# Patient Record
Sex: Female | Born: 1995
Health system: Southern US, Community
[De-identification: ages and names within clinical notes are randomized; demographics above are authoritative.]

## PROBLEM LIST (undated history)

## (undated) DIAGNOSIS — B019 Varicella without complication: Secondary | ICD-10-CM

## (undated) HISTORY — PX: TOOTH EXTRACTION: SUR596

## (undated) HISTORY — PX: WISDOM TOOTH EXTRACTION: SHX21

## (undated) HISTORY — PX: TONSILLECTOMY: SUR1361

## (undated) HISTORY — PX: ADENOIDECTOMY: SUR15

## (undated) HISTORY — DX: Varicella without complication: B01.9

## (undated) HISTORY — PX: SHOULDER SURGERY: SHX246

---
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
## 2016-01-29 ENCOUNTER — Ambulatory Visit (INDEPENDENT_AMBULATORY_CARE_PROVIDER_SITE_OTHER): Payer: BLUE CROSS/BLUE SHIELD | Admitting: Psychology

## 2016-01-29 DIAGNOSIS — F4323 Adjustment disorder with mixed anxiety and depressed mood: Secondary | ICD-10-CM | POA: Diagnosis not present

## 2016-02-15 ENCOUNTER — Ambulatory Visit: Payer: Self-pay | Admitting: Psychology

## 2016-07-18 ENCOUNTER — Ambulatory Visit: Payer: Self-pay | Admitting: Internal Medicine

## 2016-07-23 ENCOUNTER — Ambulatory Visit (INDEPENDENT_AMBULATORY_CARE_PROVIDER_SITE_OTHER): Payer: BLUE CROSS/BLUE SHIELD | Admitting: Internal Medicine

## 2016-07-23 ENCOUNTER — Encounter: Payer: Self-pay | Admitting: Internal Medicine

## 2016-07-23 ENCOUNTER — Telehealth: Payer: Self-pay

## 2016-07-23 VITALS — BP 104/70 | HR 78 | Temp 98.8°F | Wt 122.8 lb

## 2016-07-23 DIAGNOSIS — F418 Other specified anxiety disorders: Secondary | ICD-10-CM | POA: Diagnosis not present

## 2016-07-23 DIAGNOSIS — F419 Anxiety disorder, unspecified: Principal | ICD-10-CM

## 2016-07-23 DIAGNOSIS — F329 Major depressive disorder, single episode, unspecified: Secondary | ICD-10-CM | POA: Insufficient documentation

## 2016-07-23 DIAGNOSIS — F32A Depression, unspecified: Secondary | ICD-10-CM | POA: Insufficient documentation

## 2016-07-23 MED ORDER — ESCITALOPRAM OXALATE 10 MG PO TABS: 10.0000 mg | ORAL_TABLET | Freq: Every day | ORAL | 1 refills | Status: DC

## 2016-07-23 NOTE — Patient Instructions (Signed)
Generalized Anxiety Disorder Generalized anxiety disorder (GAD) is a mental disorder. It interferes with life functions, including relationships, work, and school. GAD is different from normal anxiety, which everyone experiences at some point in their lives in response to specific life events and activities. Normal anxiety actually helps us prepare for and get through these life events and activities. Normal anxiety goes away after the event or activity is over.  GAD causes anxiety that is not necessarily related to specific events or activities. It also causes excess anxiety in proportion to specific events or activities. The anxiety associated with GAD is also difficult to control. GAD can vary from mild to severe. People with severe GAD can have intense waves of anxiety with physical symptoms (panic attacks).  SYMPTOMS The anxiety and worry associated with GAD are difficult to control. This anxiety and worry are related to many life events and activities and also occur more days than not for 6 months or longer. People with GAD also have three or more of the following symptoms (one or more in children):  Restlessness.   Fatigue.  Difficulty concentrating.   Irritability.  Muscle tension.  Difficulty sleeping or unsatisfying sleep. DIAGNOSIS GAD is diagnosed through an assessment by your health care provider. Your health care provider will ask you questions aboutyour mood,physical symptoms, and events in your life. Your health care provider may ask you about your medical history and use of alcohol or drugs, including prescription medicines. Your health care provider may also do a physical exam and blood tests. Certain medical conditions and the use of certain substances can cause symptoms similar to those associated with GAD. Your health care provider may refer you to a mental health specialist for further evaluation. TREATMENT The following therapies are usually used to treat GAD:    Medication. Antidepressant medication usually is prescribed for long-term daily control. Antianxiety medicines may be added in severe cases, especially when panic attacks occur.   Talk therapy (psychotherapy). Certain types of talk therapy can be helpful in treating GAD by providing support, education, and guidance. A form of talk therapy called cognitive behavioral therapy can teach you healthy ways to think about and react to daily life events and activities.  Stress managementtechniques. These include yoga, meditation, and exercise and can be very helpful when they are practiced regularly. A mental health specialist can help determine which treatment is best for you. Some people see improvement with one therapy. However, other people require a combination of therapies.   This information is not intended to replace advice given to you by your health care provider. Make sure you discuss any questions you have with your health care provider.   Document Released: 02/01/2013 Document Revised: 10/28/2014 Document Reviewed: 02/01/2013 Elsevier Interactive Patient Education 2016 Elsevier Inc.  

## 2016-07-23 NOTE — Telephone Encounter (Signed)
Victoria Brooks pts mom left v/m requesting cb about med. No DPR signed. When I called back Victoria Brooks had spoken with pharmacist and nothing further needed.

## 2016-07-23 NOTE — Progress Notes (Signed)
   Subjective:    Patient ID: Victoria Brooks, female    DOB: November 08, 1995, 20 y.o.   MRN: [id number]  HPI  Pt presents to the clinic today with c/o anxiety and depression. She started noticing these feelings back in high school, but she was never treated for this. She feels like she worries about everything, she is afraid that something bad may happen. She feels down, depressed and hopeless. She has lost interest in things that she enjoys. She is not sure what is triggering this. She has a good home life. She does not work or go to school. She is not having any issues with her friends. She did go to a therapist for 1 session, 3 months ago but reports she did not feel like it was therapuetic. She has a family history of anxiety and depression in her mother who is not treated for this.  She denies SI/HI.  Review of Systems      Past Medical History:  Diagnosis Date  . Chicken pox     Current Outpatient Prescriptions  Medication Sig Dispense Refill  . TRINESSA LO 0.18/0.215/0.25 MG-25 MCG tab      No current facility-administered medications for this visit.     No Known Allergies  Family History  Problem Relation Age of Onset  . Hypertension Maternal Uncle   . Cancer Maternal Grandmother     breast  . Heart disease Father   . Alzheimer's disease Maternal Grandfather     Social History   Social History  . Marital status: Single    Spouse name: N/A  . Number of children: N/A  . Years of education: N/A   Occupational History  . Not on file.   Social History Main Topics  . Smoking status: Never Smoker  . Smokeless tobacco: Not on file  . Alcohol use No  . Drug use: No  . Sexual activity: No   Other Topics Concern  . Not on file   Social History Narrative   Parents Divorced   Mom is custodial parent but, dad remains very involved   Brother 1 year older   Mom works for [employer]     Constitutional: Denies fever, malaise, fatigue, headache or  abrupt weight changes.  Neurological: Denies dizziness, difficulty with memory, difficulty with speech or problems with balance and coordination.  Psych: Pt reports anxiety and depression. Denies SI/HI.  No other specific complaints in a complete review of systems (except as listed in HPI above).  Objective:   Physical Exam   BP 104/70   Pulse 78   Temp 98.8 F (37.1 C) (Oral)   Wt 122 lb 12 oz (55.7 kg)   LMP 07/08/2016   SpO2 98%  Wt Readings from Last 3 Encounters:  07/23/16 122 lb 12 oz (55.7 kg) (43 %, Z= -0.17)*  09/07/14 136 lb (61.7 kg) (73 %, Z= 0.62)*  08/01/12 127 lb (57.6 kg) (70 %, Z= 0.54)*   * Growth percentiles are based on CDC 2-20 Years data.    General: Appears her stated age, well developed, well nourished in NAD. Neurological: Alert and oriented.  Psychiatric: Mood and affect flat.     Assessment & Plan:

## 2016-07-23 NOTE — Assessment & Plan Note (Signed)
Unknown cause She is not interested in referral to therapy to discuss this further Support offered today Discussed medication management- benefits, risks, side effects eRx for Lexapro 10 mg QHS  RTC in 4-6 weeks to follow up anxiety and depression

## 2016-08-05 ENCOUNTER — Ambulatory Visit (INDEPENDENT_AMBULATORY_CARE_PROVIDER_SITE_OTHER): Payer: BLUE CROSS/BLUE SHIELD | Admitting: Psychology

## 2016-08-05 DIAGNOSIS — F321 Major depressive disorder, single episode, moderate: Secondary | ICD-10-CM | POA: Diagnosis not present

## 2016-08-19 ENCOUNTER — Ambulatory Visit: Payer: Self-pay | Admitting: Psychology

## 2016-08-20 ENCOUNTER — Ambulatory Visit: Payer: BLUE CROSS/BLUE SHIELD | Admitting: Internal Medicine

## 2016-08-22 ENCOUNTER — Encounter: Payer: Self-pay | Admitting: Internal Medicine

## 2016-08-22 ENCOUNTER — Ambulatory Visit (INDEPENDENT_AMBULATORY_CARE_PROVIDER_SITE_OTHER): Payer: BLUE CROSS/BLUE SHIELD | Admitting: Internal Medicine

## 2016-08-22 DIAGNOSIS — F329 Major depressive disorder, single episode, unspecified: Secondary | ICD-10-CM

## 2016-08-22 DIAGNOSIS — F418 Other specified anxiety disorders: Secondary | ICD-10-CM | POA: Diagnosis not present

## 2016-08-22 DIAGNOSIS — F32A Depression, unspecified: Secondary | ICD-10-CM

## 2016-08-22 DIAGNOSIS — F419 Anxiety disorder, unspecified: Principal | ICD-10-CM

## 2016-08-22 MED ORDER — ESCITALOPRAM OXALATE 20 MG PO TABS: 20.0000 mg | ORAL_TABLET | Freq: Every day | ORAL | 1 refills | Status: DC

## 2016-08-22 NOTE — Assessment & Plan Note (Addendum)
Unchanged Will stop Lexapro  Increase Lexapro to 20 mg daily If symptoms persist or worsen, will stop Lexapro and start Prozac

## 2016-08-22 NOTE — Patient Instructions (Signed)
Major Depressive Disorder Major depressive disorder is a mental illness. It also may be called clinical depression or unipolar depression. Major depressive disorder usually causes feelings of sadness, hopelessness, or helplessness. Some people with this disorder do not feel particularly sad but lose interest in doing things they used to enjoy (anhedonia). Major depressive disorder also can cause physical symptoms. It can interfere with work, school, relationships, and other normal everyday activities. The disorder varies in severity but is longer lasting and more serious than the sadness we all feel from time to time in our lives. Major depressive disorder often is triggered by stressful life events or major life changes. Examples of these triggers include divorce, loss of your job or home, a move, and the death of a family member or close friend. Sometimes this disorder occurs for no obvious reason at all. People who have family members with major depressive disorder or bipolar disorder are at higher risk for developing this disorder, with or without life stressors. Major depressive disorder can occur at any age. It may occur just once in your life (single episode major depressive disorder). It may occur multiple times (recurrent major depressive disorder). SYMPTOMS People with major depressive disorder have either anhedonia or depressed mood on nearly a daily basis for at least 2 weeks or longer. Symptoms of depressed mood include:  Feelings of sadness (blue or down in the dumps) or emptiness.  Feelings of hopelessness or helplessness.  Tearfulness or episodes of crying (may be observed by others).  Irritability (children and adolescents). In addition to depressed mood or anhedonia or both, people with this disorder have at least four of the following symptoms:  Difficulty sleeping or sleeping too much.   Significant change (increase or decrease) in appetite or weight.   Lack of energy or  motivation.  Feelings of guilt and worthlessness.   Difficulty concentrating, remembering, or making decisions.  Unusually slow movement (psychomotor retardation) or restlessness (as observed by others).   Recurrent wishes for death, recurrent thoughts of self-harm (suicide), or a suicide attempt. People with major depressive disorder commonly have persistent negative thoughts about themselves, other people, and the world. People with severe major depressive disorder may experiencedistorted beliefs or perceptions about the world (psychotic delusions). They also may see or hear things that are not real (psychotic hallucinations). DIAGNOSIS Major depressive disorder is diagnosed through an assessment by your health care provider. Your health care provider will ask aboutaspects of your daily life, such as mood,sleep, and appetite, to see if you have the diagnostic symptoms of major depressive disorder. Your health care provider may ask about your medical history and use of alcohol or drugs, including prescription medicines. Your health care provider also may do a physical exam and blood work. This is because certain medical conditions and the use of certain substances can cause major depressive disorder-like symptoms (secondary depression). Your health care provider also may refer you to a mental health specialist for further evaluation and treatment. TREATMENT It is important to recognize the symptoms of major depressive disorder and seek treatment. The following treatments can be prescribed for this disorder:   Medicine. Antidepressant medicines usually are prescribed. Antidepressant medicines are thought to correct chemical imbalances in the brain that are commonly associated with major depressive disorder. Other types of medicine may be added if the symptoms do not respond to antidepressant medicines alone or if psychotic delusions or hallucinations occur.  Talk therapy. Talk therapy can be  helpful in treating major depressive disorder by providing   support, education, and guidance. Certain types of talk therapy also can help with negative thinking (cognitive behavioral therapy) and with relationship issues that trigger this disorder (interpersonal therapy). A mental health specialist can help determine which treatment is best for you. Most people with major depressive disorder do well with a combination of medicine and talk therapy. Treatments involving electrical stimulation of the brain can be used in situations with extremely severe symptoms or when medicine and talk therapy do not work over time. These treatments include electroconvulsive therapy, transcranial magnetic stimulation, and vagal nerve stimulation.   This information is not intended to replace advice given to you by your health care provider. Make sure you discuss any questions you have with your health care provider.   Document Released: 02/01/2013 Document Revised: 10/28/2014 Document Reviewed: 02/01/2013 Elsevier Interactive Patient Education 2016 Elsevier Inc.  

## 2016-08-22 NOTE — Progress Notes (Signed)
   Subjective:    Patient ID: Victoria Brooks, female    DOB: 07-18-1996, 20 y.o.   MRN: [id number]  HPI  Pt presents to the clinic today to follow up anxiety and depression. She was started on Lexapro 10 mg at her last visit. She has been taking the medication as prescribed. She reports no improvement in her symptoms but reports they are not worse. She still feels anxious and on edge, worrying all the time. She still feels down and depressed. She is not sure what is triggering this. She denies SI/HI.  Review of Systems      Past Medical History:  Diagnosis Date  . Chicken pox     Current Outpatient Prescriptions  Medication Sig Dispense Refill  . escitalopram (LEXAPRO) 10 MG tablet Take 1 tablet (10 mg total) by mouth at bedtime. 30 tablet 1  . TRINESSA LO 0.18/0.215/0.25 MG-25 MCG tab      No current facility-administered medications for this visit.     No Known Allergies  Family History  Problem Relation Age of Onset  . Hypertension Maternal Uncle   . Cancer Maternal Grandmother     breast  . Heart disease Father   . Alzheimer's disease Maternal Grandfather     Social History   Social History  . Marital status: Single    Spouse name: N/A  . Number of children: N/A  . Years of education: N/A   Occupational History  . Not on file.   Social History Main Topics  . Smoking status: Never Smoker  . Smokeless tobacco: Not on file  . Alcohol use No  . Drug use: No  . Sexual activity: No   Other Topics Concern  . Not on file   Social History Narrative   Parents Divorced   Mom is custodial parent but, dad remains very involved   Brother 1 year older   Mom works for [employer]     Constitutional: Denies fever, malaise, fatigue, headache or abrupt weight changes.  Neurological: Denies dizziness, difficulty with memory, difficulty with speech or problems with balance and coordination.  Psych: Pt reports anxiety and depression. Denies  SI/HI.  No other specific complaints in a complete review of systems (except as listed in HPI above).  Objective:   Physical Exam  BP 106/68   Pulse 70   Temp 97.7 F (36.5 C) (Oral)   Wt 127 lb (57.6 kg)   LMP 08/08/2016   SpO2 98%  Wt Readings from Last 3 Encounters:  08/22/16 127 lb (57.6 kg) (51 %, Z= 0.03)*  07/23/16 122 lb 12 oz (55.7 kg) (43 %, Z= -0.17)*  09/07/14 136 lb (61.7 kg) (73 %, Z= 0.62)*   * Growth percentiles are based on CDC 2-20 Years data.    General: Appears her stated age, well developed, well nourished in NAD. Neurological: Alert and oriented.  Psychiatric: Mood and affect flat.          Assessment & Plan:

## 2016-08-29 ENCOUNTER — Encounter: Payer: Self-pay | Admitting: Internal Medicine

## 2016-09-03 ENCOUNTER — Ambulatory Visit (INDEPENDENT_AMBULATORY_CARE_PROVIDER_SITE_OTHER): Payer: BLUE CROSS/BLUE SHIELD | Admitting: Psychology

## 2016-09-03 DIAGNOSIS — F321 Major depressive disorder, single episode, moderate: Secondary | ICD-10-CM | POA: Diagnosis not present

## 2016-09-23 ENCOUNTER — Ambulatory Visit: Payer: BLUE CROSS/BLUE SHIELD | Admitting: Psychology

## 2016-10-02 ENCOUNTER — Encounter: Payer: Self-pay | Admitting: Internal Medicine

## 2016-10-03 ENCOUNTER — Ambulatory Visit: Payer: Self-pay | Admitting: Internal Medicine

## 2016-10-03 MED ORDER — ESCITALOPRAM OXALATE 20 MG PO TABS: 20.0000 mg | ORAL_TABLET | Freq: Every day | ORAL | 5 refills | Status: DC

## 2016-10-12 ENCOUNTER — Encounter: Payer: Self-pay | Admitting: Internal Medicine

## 2016-10-15 ENCOUNTER — Other Ambulatory Visit: Payer: Self-pay | Admitting: Internal Medicine

## 2016-10-15 ENCOUNTER — Telehealth: Payer: Self-pay

## 2016-10-15 NOTE — Telephone Encounter (Signed)
Amy called re reifll lexapro; spoke with Cina at [facility] and pt has available refills and will get refill ready. Amy notified and voiced understanding.

## 2017-01-12 ENCOUNTER — Encounter: Payer: Self-pay | Admitting: Internal Medicine

## 2017-03-26 ENCOUNTER — Ambulatory Visit (INDEPENDENT_AMBULATORY_CARE_PROVIDER_SITE_OTHER): Payer: Managed Care, Other (non HMO) | Admitting: Family Medicine

## 2017-03-26 ENCOUNTER — Encounter: Payer: Self-pay | Admitting: Family Medicine

## 2017-03-26 VITALS — BP 102/64 | HR 99 | Temp 98.3°F | Wt 129.0 lb

## 2017-03-26 DIAGNOSIS — Z113 Encounter for screening for infections with a predominantly sexual mode of transmission: Secondary | ICD-10-CM | POA: Diagnosis not present

## 2017-03-26 DIAGNOSIS — N926 Irregular menstruation, unspecified: Secondary | ICD-10-CM

## 2017-03-26 DIAGNOSIS — L731 Pseudofolliculitis barbae: Secondary | ICD-10-CM | POA: Insufficient documentation

## 2017-03-26 NOTE — Progress Notes (Signed)
Subjective:    Patient ID: Victoria Brooks, female    DOB: Sep 11, 1996, 21 y.o.   MRN: [id number]  HPI Here for gyn problems Menses for 18 days  Also swollen LN in groin R side   Wt Readings from Last 3 Encounters:  03/26/17 129 lb (58.5 kg) (52 %, Z= 0.06)*  08/22/16 127 lb (57.6 kg) (51 %, Z= 0.03)*  07/23/16 122 lb 12 oz (55.7 kg) (43 %, Z= -0.17)*   * Growth percentiles are based on CDC 2-20 Years data.    Is on tri sprintek for OC   Periods are usually regular  Usually 3 heavy days and then 2 light days    This period- 3-4 heavy days and then over 2 wk of light days  Not a lot of cramping  On time-not late or early  Never happened before  Took one pill late - a week before menses (missed a day)   She is sexually active  Does not usually use condoms She has never been tested for STDS  Sometimes has vaginal d/c (usually clear)  No itching or burning  No pelvic pain   Has not had a pap test due to age  Has declined HPV vaccine so far   Patient Active Problem List   Diagnosis Date Noted  . Abnormal menses 03/26/2017  . Ingrown hair 03/26/2017  . Screen for STD (sexually transmitted disease) 03/26/2017  . Anxiety and depression 07/23/2016   Past Medical History:  Diagnosis Date  . Chicken pox    Past Surgical History:  Procedure Laterality Date  . ADENOIDECTOMY    . SHOULDER SURGERY Right   . TONSILLECTOMY    . WISDOM TOOTH EXTRACTION     Social History  Substance Use Topics  . Smoking status: Former Games developer  . Smokeless tobacco: Never Used  . Alcohol use No   Family History  Problem Relation Age of Onset  . Heart disease Father   . Hypertension Maternal Uncle   . Cancer Maternal Grandmother        breast  . Alzheimer's disease Maternal Grandfather    No Known Allergies Current Outpatient Prescriptions on File Prior to Visit  Medication Sig Dispense Refill  . escitalopram (LEXAPRO) 20 MG tablet Take 1 tablet (20 mg total) by mouth at bedtime. 30  tablet 5   No current facility-administered medications on file prior to visit.     Review of Systems Review of Systems  Constitutional: Negative for fever, appetite change, fatigue and unexpected weight change.  Eyes: Negative for pain and visual disturbance.  Respiratory: Negative for cough and shortness of breath.   Cardiovascular: Negative for cp or palpitations    Gastrointestinal: Negative for nausea, diarrhea and constipation.  Genitourinary: Negative for urgency and frequency. neg for pelvic pain or changed vaginal d/c  Skin: Negative for pallor or rash   Neurological: Negative for weakness, light-headedness, numbness and headaches.  Hematological: Negative for adenopathy. Does not bruise/bleed easily.  Psychiatric/Behavioral: Negative for dysphoric mood. The patient is not nervous/anxious.         Objective:   Physical Exam  Constitutional: She appears well-developed and well-nourished. No distress.  Well appearing   Eyes: Conjunctivae and EOM are normal. Pupils are equal, round, and reactive to light.  Neck: Normal range of motion. Neck supple. No thyromegaly present.  Cardiovascular: Regular rhythm.   Pulmonary/Chest: Effort normal and breath sounds normal.  Abdominal: Soft. Bowel sounds are normal. She exhibits no distension and no mass.  There is no tenderness. There is no rebound and no guarding.  No suprapubic tenderness or fullness    Lymphadenopathy:    She has no cervical adenopathy.       Right: No inguinal adenopathy present.       Left: No inguinal adenopathy present.  Neurological: She is alert.  Skin: Skin is warm and dry. No rash noted. No erythema. No pallor.  R groin area-ingrown hair noted with some mild induration and erythema 1/2 by 1 cm  No pus or fluctuance   No LN noted  Psychiatric: She has a normal mood and affect.          Assessment & Plan:   Problem List Items Addressed This Visit      Musculoskeletal and Integument   Ingrown hair     R groin area- inflamed but does not appear infected  Recommend keeping clean with soap and water and use of warm compresses Stop shaving in this area  Alert if worse redness/swelling or pain        Other   Abnormal menses    Pt had an extended menstrual period- after missing a pill late in her pack  I suspect this is the reason She was also starting school/ under stress  Will continue current OC- stressed imp of compliance and risk of menstrual irreg or preg if she forgets Plan made to set alarm to take pill  If problems continue - she will let us know  Also reminded re: condom use to prevent stds        Screen for STD (sexually transmitted disease)    gc chlamydia screen today  She declines hiv or rpr  She declines hpv vaccine but enc to consider it later       Relevant Orders   GC/Chlamydia Probe Amp (Completed)

## 2017-03-26 NOTE — Patient Instructions (Signed)
You have an ingrown hair  Use a warm compress when able  Keep clean with soap and water Stop shaving  Stay on your oral contraceptive and set an alarm so you do not miss pills If period issues continue -let us know   We will do a urine screening for Gonorrhea and Chlamydia   I think the HPV vaccine is important -keep thinking about getting it

## 2017-03-27 LAB — GC/CHLAMYDIA PROBE AMP
CT Probe RNA: NOT DETECTED
GC Probe RNA: NOT DETECTED

## 2017-03-27 NOTE — Assessment & Plan Note (Signed)
gc chlamydia screen today  She declines hiv or rpr  She declines hpv vaccine but enc to consider it later

## 2017-03-27 NOTE — Assessment & Plan Note (Signed)
R groin area- inflamed but does not appear infected  Recommend keeping clean with soap and water and use of warm compresses Stop shaving in this area  Alert if worse redness/swelling or pain

## 2017-03-27 NOTE — Assessment & Plan Note (Signed)
Pt had an extended menstrual period- after missing a pill late in her pack  I suspect this is the reason She was also starting school/ under stress  Will continue current OC- stressed imp of compliance and risk of menstrual irreg or preg if she forgets Plan made to set alarm to take pill  If problems continue - she will let us know  Also reminded re: condom use to prevent stds

## 2017-04-27 ENCOUNTER — Other Ambulatory Visit: Payer: Self-pay | Admitting: Internal Medicine

## 2017-04-27 ENCOUNTER — Encounter: Payer: Self-pay | Admitting: Internal Medicine

## 2017-04-28 MED ORDER — ESCITALOPRAM OXALATE 20 MG PO TABS: 20.0000 mg | ORAL_TABLET | Freq: Every day | ORAL | 1 refills | Status: DC

## 2017-04-29 ENCOUNTER — Encounter: Payer: Self-pay | Admitting: Internal Medicine

## 2017-05-01 ENCOUNTER — Other Ambulatory Visit: Payer: Self-pay | Admitting: Internal Medicine

## 2017-06-25 ENCOUNTER — Encounter: Payer: Self-pay | Admitting: Internal Medicine

## 2017-06-30 ENCOUNTER — Other Ambulatory Visit: Payer: Self-pay | Admitting: Internal Medicine

## 2017-07-04 ENCOUNTER — Other Ambulatory Visit: Payer: Self-pay | Admitting: Internal Medicine

## 2017-07-29 ENCOUNTER — Ambulatory Visit: Payer: Self-pay | Admitting: Internal Medicine

## 2017-08-05 ENCOUNTER — Other Ambulatory Visit: Payer: Self-pay | Admitting: Internal Medicine

## 2017-08-12 ENCOUNTER — Ambulatory Visit: Payer: Self-pay | Admitting: Internal Medicine

## 2017-08-25 ENCOUNTER — Encounter: Payer: Self-pay | Admitting: Internal Medicine

## 2017-08-25 ENCOUNTER — Telehealth: Payer: Self-pay

## 2017-08-25 NOTE — Telephone Encounter (Signed)
That slot appears to have been taken already. Has she been rescheduled?

## 2017-08-25 NOTE — Telephone Encounter (Signed)
There is a 4930' appt on 08/26/17 at 8:15 but it is followed by a CPX.Please advise.

## 2017-08-25 NOTE — Telephone Encounter (Signed)
PLEASE NOTE: All timestamps contained within this report are represented as [place] Standard Time. CONFIDENTIALTY NOTICE: This fax transmission is intended only for the addressee. It contains information that is legally privileged, confidential or otherwise protected from use or disclosure. If you are not the intended recipient, you are strictly prohibited from reviewing, disclosing, copying using or disseminating any of this information or taking any action in reliance on or regarding this information. If you have received this fax in error, please notify us immediately by telephone so that we can arrange for its return to us. Phone: [phone], Toll-Free: [phone], Fax: [phone] Page: 1 of 1 Call Id: [id number] [facility] - Client Nonclinical Telephone Record [facility] Client [facility] - Client [facility] Night Physician Nicki ReaperBaity, [place] - NP Contact Type Call Who Is Calling Patient / Member / Family / Caregiver Caller Name Kate SableHayley Toutant Caller Phone Number [phone] Patient Name Kate SableHayley Kath Call Type Message Only Information Provided Reason for Call Request to Reschedule Office Appointment Initial Comment Caller needs to reschedule her appointment for tomorrow, November 6. 2018. Additional Comment Call Closed By: Milagros EvenerAmy Carter Transaction Date/Time: 08/24/2017 6:04:32 PM (ET)

## 2017-09-09 ENCOUNTER — Ambulatory Visit: Payer: Self-pay | Admitting: Internal Medicine

## 2017-09-09 ENCOUNTER — Telehealth: Payer: Self-pay

## 2017-09-09 NOTE — Progress Notes (Deleted)
   Subjective:    Patient ID: Victoria Brooks, female    DOB: [birth year], 21 y.o.   MRN: [id number]  HPI  Pt presents to the clinic today for her annual exam. She is also due to follow up chronic conditions.  Flu: Tetanus: Dentist:  Diet: Exercise:   Review of Systems      Past Medical History:  Diagnosis Date  . Chicken pox     Current Outpatient Medications  Medication Sig Dispense Refill  . escitalopram (LEXAPRO) 20 MG tablet TAKE 1 TABLET (20 MG TOTAL) BY MOUTH AT BEDTIME. MUST SCHEDULE ANNUAL PHYSICAL 30 tablet 0  . TRI-SPRINTEC 0.18/0.215/0.25 MG-35 MCG tablet Take 1 tablet by mouth daily.     No current facility-administered medications for this visit.     No Known Allergies  Family History  Problem Relation Age of Onset  . Heart disease Father   . Hypertension Maternal Uncle   . Cancer Maternal Grandmother        breast  . Alzheimer's disease Maternal Grandfather     Social History   Socioeconomic History  . Marital status: Single    Spouse name: Not on file  . Number of children: Not on file  . Years of education: Not on file  . Highest education level: Not on file  Social Needs  . Financial resource strain: Not on file  . Food insecurity - worry: Not on file  . Food insecurity - inability: Not on file  . Transportation needs - medical: Not on file  . Transportation needs - non-medical: Not on file  Occupational History  . Not on file  Tobacco Use  . Smoking status: Former Games developermoker  . Smokeless tobacco: Never Used  Substance and Sexual Activity  . Alcohol use: No  . Drug use: No  . Sexual activity: No  Other Topics Concern  . Not on file  Social History Narrative   Parents Divorced   Mom is custodial parent but, dad remains very involved   Brother 1 year older   Mom works for [employer]     Constitutional: Denies fever, malaise, fatigue, headache or abrupt weight changes.  HEENT: Denies eye pain, eye redness, ear  pain, ringing in the ears, wax buildup, runny nose, nasal congestion, bloody nose, or sore throat. Respiratory: Denies difficulty breathing, shortness of breath, cough or sputum production.   Cardiovascular: Denies chest pain, chest tightness, palpitations or swelling in the hands or feet.  Gastrointestinal: Denies abdominal pain, bloating, constipation, diarrhea or blood in the stool.  GU: Denies urgency, frequency, pain with urination, burning sensation, blood in urine, odor or discharge. Musculoskeletal: Denies decrease in range of motion, difficulty with gait, muscle pain or joint pain and swelling.  Skin: Denies redness, rashes, lesions or ulcercations.  Neurological: Denies dizziness, difficulty with memory, difficulty with speech or problems with balance and coordination.  Psych: Denies anxiety, depression, SI/HI.  No other specific complaints in a complete review of systems (except as listed in HPI above).  Objective:   Physical Exam        Assessment & Plan:

## 2017-09-09 NOTE — Telephone Encounter (Signed)
Copied from CRM #[id number]. Topic: Quick Communication - Patient Running Late >> Sep 09, 2017 11:22 AM Landry MellowFoltz, Melissa J wrote: Patient called and is running 15 minutes late. Mom was told of the 10 minute late policy, and mom said that pt wouldn't be able to reschedule    Route to department's PEC pool.

## 2017-09-10 ENCOUNTER — Other Ambulatory Visit: Payer: Self-pay | Admitting: Internal Medicine

## 2017-10-07 ENCOUNTER — Ambulatory Visit (INDEPENDENT_AMBULATORY_CARE_PROVIDER_SITE_OTHER): Payer: Managed Care, Other (non HMO) | Admitting: Internal Medicine

## 2017-10-07 ENCOUNTER — Encounter: Payer: Self-pay | Admitting: Internal Medicine

## 2017-10-07 VITALS — BP 108/76 | HR 85 | Temp 98.0°F | Ht 65.75 in | Wt 136.0 lb

## 2017-10-07 DIAGNOSIS — F419 Anxiety disorder, unspecified: Secondary | ICD-10-CM | POA: Diagnosis not present

## 2017-10-07 DIAGNOSIS — F329 Major depressive disorder, single episode, unspecified: Secondary | ICD-10-CM

## 2017-10-07 DIAGNOSIS — Z0000 Encounter for general adult medical examination without abnormal findings: Secondary | ICD-10-CM | POA: Diagnosis not present

## 2017-10-07 DIAGNOSIS — F32A Depression, unspecified: Secondary | ICD-10-CM

## 2017-10-07 MED ORDER — SERTRALINE HCL 50 MG PO TABS: 50.0000 mg | ORAL_TABLET | Freq: Every day | ORAL | 11 refills | Status: DC

## 2017-10-07 NOTE — Patient Instructions (Signed)

## 2017-10-07 NOTE — Progress Notes (Signed)
Subjective:    Patient ID: Victoria SableHayley Suppes, female    DOB: 13-Jan-1996, 21 y.o.   MRN: [id number]  HPI  Pt presents to the clinic today for her annual exam. She is also due to follow up chronic conditions.  Anxiety and Depression: She does not feel like the Lexapro is as effective as it used to be. She is interested in trying something new. She did not feel therapy was effective. She denies SI/HI.  Flu: never Tetanus: 04/2009 Dentist: biannually  Diet: She does eat meat. She consumes fruits and veggies daily. She does eat fried foods. She drinks mostly water and juice. Exercise: None  Review of Systems      Past Medical History:  Diagnosis Date  . Chicken pox     Current Outpatient Medications  Medication Sig Dispense Refill  . escitalopram (LEXAPRO) 20 MG tablet TAKE 1 TABLET (20 MG TOTAL) BY MOUTH AT BEDTIME. MUST SCHEDULE ANNUAL PHYSICAL 30 tablet 0  . TRI-SPRINTEC 0.18/0.215/0.25 MG-35 MCG tablet Take 1 tablet by mouth daily.     No current facility-administered medications for this visit.     No Known Allergies  Family History  Problem Relation Age of Onset  . Heart disease Father   . Hypertension Maternal Uncle   . Cancer Maternal Grandmother        breast  . Alzheimer's disease Maternal Grandfather     Social History   Socioeconomic History  . Marital status: Single    Spouse name: Not on file  . Number of children: Not on file  . Years of education: Not on file  . Highest education level: Not on file  Social Needs  . Financial resource strain: Not on file  . Food insecurity - worry: Not on file  . Food insecurity - inability: Not on file  . Transportation needs - medical: Not on file  . Transportation needs - non-medical: Not on file  Occupational History  . Not on file  Tobacco Use  . Smoking status: Former Games developermoker  . Smokeless tobacco: Never Used  Substance and Sexual Activity  . Alcohol use: No  . Drug use: No  . Sexual activity: No  Other  Topics Concern  . Not on file  Social History Narrative   Parents Divorced   Mom is custodial parent but, dad remains very involved   Brother 1 year older   Mom works for [employer] insurance     Constitutional: Denies fever, malaise, fatigue, headache or abrupt weight changes.  HEENT: Denies eye pain, eye redness, ear pain, ringing in the ears, wax buildup, runny nose, nasal congestion, bloody nose, or sore throat. Respiratory: Denies difficulty breathing, shortness of breath, cough or sputum production.   Cardiovascular: Denies chest pain, chest tightness, palpitations or swelling in the hands or feet.  Gastrointestinal: Denies abdominal pain, bloating, constipation, diarrhea or blood in the stool.  GU: Denies urgency, frequency, pain with urination, burning sensation, blood in urine, odor or discharge. Musculoskeletal: Denies decrease in range of motion, difficulty with gait, muscle pain or joint pain and swelling.  Skin: Denies redness, rashes, lesions or ulcercations.  Neurological: Denies dizziness, difficulty with memory, difficulty with speech or problems with balance and coordination.  Psych: Pt reports anxiety and depression. Denies SI/HI.  No other specific complaints in a complete review of systems (except as listed in HPI above).  Objective:   Physical Exam   BP 108/76   Pulse 85   Temp 98 F (36.7 C) (Oral)  Ht 5' 5.75" (1.67 m)   Wt 136 lb (61.7 kg)   LMP 09/29/2017   SpO2 98%   BMI 22.12 kg/m  Wt Readings from Last 3 Encounters:  10/07/17 136 lb (61.7 kg)  03/26/17 129 lb (58.5 kg) (52 %, Z= 0.06)*  08/22/16 127 lb (57.6 kg) (51 %, Z= 0.03)*   * Growth percentiles are based on CDC (Girls, 2-20 Years) data.    General: Appears her stated age, well developed, well nourished in NAD. Skin: Warm, dry and intact. No rashes, lesions or ulcerations noted. HEENT: Head: normal shape and size; Eyes: sclera white, no icterus, conjunctiva pink, PERRLA and  EOMs intact; Ears: Tm's gray and intact, normal light reflex;  Throat/Mouth: Teeth present, mucosa pink and moist, no exudate, lesions or ulcerations noted.  Neck:  Neck supple, trachea midline. No masses, lumps or thyromegaly present.  Cardiovascular: Normal rate and rhythm. S1,S2 noted.  No murmur, rubs or gallops noted. No JVD or BLE edema. Pulmonary/Chest: Normal effort and positive vesicular breath sounds. No respiratory distress. No wheezes, rales or ronchi noted.  Abdomen: Soft and nontender. Normal bowel sounds. No distention or masses noted. Liver, spleen and kidneys non palpable. Musculoskeletal: Strength 5/5 BUE/BLE. No difficulty with gait.  Neurological: Alert and oriented. Cranial nerves II-XII grossly intact. Coordination normal.  Psychiatric: Mood and affect mildly flat. Behavior is normal. Judgment and thought content normal.           Assessment & Plan:   Preventative Health Maintenance:  She declines flu shot Tetanus UTD Encouraged her to consume a balanced diet and exercise regimen Advised her see a dentist annually She declines labs or STD screening today  RTC in 1 year, sooner if needed Nicki ReaperBAITY, Maddon Horton, NP

## 2017-10-07 NOTE — Assessment & Plan Note (Signed)
Will stop Lexapro Start Zoloft 50 mg daily Support offered today  Let me know how you are doing in 4 weeks via mychart

## 2017-11-23 ENCOUNTER — Encounter: Payer: Self-pay | Admitting: Internal Medicine

## 2017-12-18 ENCOUNTER — Ambulatory Visit: Payer: 59 | Admitting: Psychology

## 2017-12-18 ENCOUNTER — Telehealth: Payer: Self-pay

## 2017-12-18 NOTE — Telephone Encounter (Signed)
Pt had appt to see Anita, psychologist to[facility],but Syn[facility] Failnita was behind by 20' and pt did not feel like waiting; has had prod cough with clear to yellow phlegm for 3 - 4 days, this AM started with vomiting x4 and diarrhea x 1. Pt works at day care; no abd pain, fever, congestion wheezing, SOB, S/T or earache. Now BP 116/68. T 98 P 88 and pulse ox 96%. No available appts [facility] or [place]; pt does not want to drive to [place]. Pamala Hurry Baity NP said to go to [facility] to be eval. Pt voiced understanding.

## 2017-12-24 ENCOUNTER — Ambulatory Visit: Payer: 59 | Admitting: Psychology

## 2017-12-27 ENCOUNTER — Encounter: Payer: Self-pay | Admitting: Internal Medicine

## 2017-12-29 ENCOUNTER — Encounter: Payer: Self-pay | Admitting: Internal Medicine

## 2017-12-31 ENCOUNTER — Encounter: Payer: Self-pay | Admitting: Internal Medicine

## 2017-12-31 MED ORDER — SERTRALINE HCL 100 MG PO TABS: 100.0000 mg | ORAL_TABLET | Freq: Every day | ORAL | 1 refills | Status: DC

## 2018-01-23 ENCOUNTER — Other Ambulatory Visit: Payer: Self-pay

## 2018-01-23 MED ORDER — SERTRALINE HCL 100 MG PO TABS: 100.0000 mg | ORAL_TABLET | Freq: Every day | ORAL | 1 refills | Status: DC

## 2018-07-13 ENCOUNTER — Encounter: Payer: Self-pay | Admitting: Internal Medicine

## 2018-07-20 ENCOUNTER — Ambulatory Visit (INDEPENDENT_AMBULATORY_CARE_PROVIDER_SITE_OTHER): Payer: Managed Care, Other (non HMO) | Admitting: Internal Medicine

## 2018-07-20 ENCOUNTER — Encounter: Payer: Self-pay | Admitting: Internal Medicine

## 2018-07-20 DIAGNOSIS — F419 Anxiety disorder, unspecified: Secondary | ICD-10-CM | POA: Diagnosis not present

## 2018-07-20 DIAGNOSIS — F329 Major depressive disorder, single episode, unspecified: Secondary | ICD-10-CM

## 2018-07-20 DIAGNOSIS — F32A Depression, unspecified: Secondary | ICD-10-CM

## 2018-07-20 MED ORDER — VENLAFAXINE HCL ER 37.5 MG PO CP24: 37.5000 mg | ORAL_CAPSULE | Freq: Every day | ORAL | 2 refills | Status: DC

## 2018-07-20 NOTE — Progress Notes (Signed)
Subjective:    Patient ID: Victoria Brooks, female    DOB: 1996-07-04, 21 y.o.   MRN: [id number]  HPI  Pt presents to the clinic today to follow up anxiety and depression. She reports she feels more down, irritable and increase anxiety. She is not sure what her triggers are, just general life stress. She is taking Sertraline as prescribed. She reports she never really felt like the medication help but just made it easier to deal with stuff. She denies SI/HI.  Review of Systems  Past Medical History:  Diagnosis Date  . Chicken pox     Current Outpatient Medications  Medication Sig Dispense Refill  . sertraline (ZOLOFT) 100 MG tablet Take 1 tablet (100 mg total) by mouth daily. 90 tablet 1  . TRI-SPRINTEC 0.18/0.215/0.25 MG-35 MCG tablet Take 1 tablet by mouth daily.     No current facility-administered medications for this visit.     No Known Allergies  Family History  Problem Relation Age of Onset  . Heart disease Father   . Hypertension Maternal Uncle   . Cancer Maternal Grandmother        breast  . Alzheimer's disease Maternal Grandfather     Social History   Socioeconomic History  . Marital status: Single    Spouse name: Not on file  . Number of children: Not on file  . Years of education: Not on file  . Highest education level: Not on file  Occupational History  . Not on file  Social Needs  . Financial resource strain: Not on file  . Food insecurity:    Worry: Not on file    Inability: Not on file  . Transportation needs:    Medical: Not on file    Non-medical: Not on file  Tobacco Use  . Smoking status: Former Games developer  . Smokeless tobacco: Never Used  Substance and Sexual Activity  . Alcohol use: No  . Drug use: No  . Sexual activity: Never  Lifestyle  . Physical activity:    Days per week: Not on file    Minutes per session: Not on file  . Stress: Not on file  Relationships  . Social connections:    Talks on phone: Not on file    Gets together:  Not on file    Attends religious service: Not on file    Active member of club or organization: Not on file    Attends meetings of clubs or organizations: Not on file    Relationship status: Not on file  . Intimate partner violence:    Fear of current or ex partner: Not on file    Emotionally abused: Not on file    Physically abused: Not on file    Forced sexual activity: Not on file  Other Topics Concern  . Not on file  Social History Narrative   Parents Divorced   Mom is custodial parent but, dad remains very involved   Brother 1 year older   Mom works for [employer]     Constitutional: Denies fever, malaise, fatigue, headache or abrupt weight changes.  Psych: Pt reports anxiety an depression. Denies SI/HI.  No other specific complaints in a complete review of systems (except as listed in HPI above).     Objective:   Physical Exam   BP 102/60   Pulse 77   Temp 98 F (36.7 C) (Oral)   Wt 121 lb (54.9 kg)   LMP 07/13/2018   SpO2 98%  BMI 19.68 kg/m  Wt Readings from Last 3 Encounters:  07/20/18 121 lb (54.9 kg)  10/07/17 136 lb (61.7 kg)  03/26/17 129 lb (58.5 kg) (52 %, Z= 0.06)*   * Growth percentiles are based on CDC (Girls, 2-20 Years) data.    General: Appears her stated age, well developed, well nourished in NAD. Cardiovascular: Normal rate and rhythm. S1,S2 noted.  No murmur, rubs or gallops noted. Psychiatric: Mood and affect normal. Behavior is normal. Judgment and thought content normal.  Pulmonary/Chest: Normal effort and positive vesicular breath sounds. No respiratory distress. No wheezes, rales or ronchi noted.  Neurological: Alert and oriented.  Psych: Mood and affect flat. Behavior and thought content seem normal.      Assessment & Plan:

## 2018-07-20 NOTE — Assessment & Plan Note (Signed)
Deteriorated Support offered today Will wean Sertraline Will trial Effexor 37.5 mg daily  Update me in 4-6 weeks and let me know how you are doing

## 2018-07-20 NOTE — Patient Instructions (Signed)
Coping With Depression, Teen Depression is an experience of feeling down, blue, or sad. Depression can affect your thoughts and feelings, relationships, daily activities, and physical health. It is caused by changes in your brain that can be triggered by stress in your life or a serious loss. Everyone experiences occasional disappointment, sadness, and loss in their lives. When you are feeling down, blue, or sad for at least 2 weeks in a row, it may mean that you have depression. If you receive a diagnosis of depression, your health care provider will tell you which type of depression you have and the possible treatments to help. How can depression affect me? Being depressed can make daily activities more difficult. It can negatively affect your daily life, from school and sports performance to work and relationships. When you are depressed, you may:  Want to be alone.  Avoid interacting with others.  Avoid doing the things you usually like to do.  Notice changes in your sleep habits.  Find it harder than usual to wake up and go to school or work.  Feel angry at everyone.  Feel like you do not have any patience.  Have trouble concentrating.  Feel tired all the time.  Notice changes in your appetite.  Lose or gain weight without trying.  Have constant headaches or stomachaches.  Think about death or attempting suicide often.  What are things I can do to deal with depression? If you have had symptoms of depression for more than 2 weeks, talk with your parents or an adult you trust, such as a counselor at school or church or a coach. You might be tempted to only tell friends, but you should tell an adult too. The hardest step in dealing with depression is admitting that you are feeling it to someone. The more people who know, the more likely you will be to get some help. Certain types of counseling can be very helpful in treating depression. A counseling professional can assess what  treatments are going to be most helpful for you. These may include:  Talk therapy.  Medicines.  Brain stimulation therapy.  There are a number of other things you can do that can help you cope with depression on a daily basis, including:  Spending time in nature.  Spending time with trusted friends who help you feel better.  Taking time to think about the positive things in your life and to feel grateful for them.  Exercising, such as playing an active game with some friends or going for a run.  Spending less time using electronics, especially at night before bed. The screens of TVs, computers, tablets, and phones make your brain think it is time to get up rather than go to bed.  Avoiding spending too much time spacing out on TV or video games. This might feel good for a while, but it ends up just being a way to avoid the feelings of depression.  What should I do if my depression gets worse? If you are having trouble managing your depression or if your depression gets worse, talk to your health care provider about making adjustments to your treatment plan. You should get help immediately if:  You feel suicidal and are making a plan to commit suicide.  You are drinking or using drugs to stop the pain from your depression.  You are cutting yourself or thinking about cutting yourself.  You are thinking about hurting others and are making a plan to do so.  You   believe the world would be better off without you in it.  You are isolating yourself completely and not talking with anyone.  If you find yourself in any of these situations, you should do one of the following:  Immediately tell your parents or best friend.  Call and go see your health care provider or health professional.  Call the suicide prevention hotline (1-800-273-8255 in the [place]  Text the crisis line (741741 in the [place]  Where can I get support? It is important to know that although depression is  serious, you can find support from a variety of sources. Sources of help may include:  Suicide prevention, crisis prevention, and depression hotlines.  School teachers, counselors, coaches, or clergy.  Parents or other family members.  Support groups.  You can locate a counselor or support group in your area from one of the following sources:  Mental Health America: www.mentalhealthamerica.net  Anxiety and Depression Association of America (ADAA): www.adaa.org  National Alliance on Mental Illness (NAMI): www.nami.org  This information is not intended to replace advice given to you by your health care provider. Make sure you discuss any questions you have with your health care provider. Document Released: 10/27/2015 Document Revised: 03/14/2016 Document Reviewed: 10/27/2015 Elsevier Interactive Patient Education  2018 [employer] Inc.  

## 2018-08-12 ENCOUNTER — Other Ambulatory Visit: Payer: Self-pay | Admitting: Internal Medicine

## 2018-09-21 ENCOUNTER — Encounter: Payer: Self-pay | Admitting: Internal Medicine

## 2018-10-23 ENCOUNTER — Other Ambulatory Visit: Payer: Self-pay | Admitting: Internal Medicine

## 2018-10-26 ENCOUNTER — Encounter: Payer: Self-pay | Admitting: Internal Medicine

## 2018-10-27 MED ORDER — VENLAFAXINE HCL ER 75 MG PO CP24: 75.0000 mg | ORAL_CAPSULE | Freq: Every day | ORAL | 2 refills | Status: DC

## 2018-11-20 ENCOUNTER — Other Ambulatory Visit: Payer: Self-pay | Admitting: Internal Medicine

## 2018-11-30 ENCOUNTER — Encounter: Payer: Self-pay | Admitting: Internal Medicine

## 2018-12-01 ENCOUNTER — Encounter: Payer: Self-pay | Admitting: Internal Medicine

## 2018-12-01 MED ORDER — VENLAFAXINE HCL ER 150 MG PO CP24: 150.0000 mg | ORAL_CAPSULE | Freq: Every day | ORAL | 2 refills | Status: DC

## 2018-12-01 NOTE — Telephone Encounter (Signed)
Lorre Munroe, NP  to @ 10:15 AM  I have increased the dose to 75 mg daily. Update me in 3 weeks if no improvement in anxiety.    Last read by [name] at 11:42 AM on 10/28/2018.

## 2018-12-08 ENCOUNTER — Encounter: Payer: Self-pay | Admitting: Internal Medicine

## 2018-12-08 ENCOUNTER — Ambulatory Visit (INDEPENDENT_AMBULATORY_CARE_PROVIDER_SITE_OTHER): Payer: 59 | Admitting: Internal Medicine

## 2018-12-08 VITALS — BP 112/76 | HR 102 | Temp 98.2°F | Ht 65.5 in | Wt 121.0 lb

## 2018-12-08 DIAGNOSIS — F419 Anxiety disorder, unspecified: Secondary | ICD-10-CM

## 2018-12-08 DIAGNOSIS — F32A Depression, unspecified: Secondary | ICD-10-CM

## 2018-12-08 DIAGNOSIS — F329 Major depressive disorder, single episode, unspecified: Secondary | ICD-10-CM

## 2018-12-08 DIAGNOSIS — Z0000 Encounter for general adult medical examination without abnormal findings: Secondary | ICD-10-CM

## 2018-12-08 NOTE — Patient Instructions (Signed)

## 2018-12-09 LAB — COMPREHENSIVE METABOLIC PANEL
ALT: 11 U/L (ref 0–35)
AST: 21 U/L (ref 0–37)
Albumin: 4.2 g/dL (ref 3.5–5.2)
Alkaline Phosphatase: 46 U/L (ref 39–117)
BUN: 12 mg/dL (ref 6–23)
CO2: 29 mEq/L (ref 19–32)
Calcium: 9.4 mg/dL (ref 8.4–10.5)
Chloride: 101 mEq/L (ref 96–112)
Creatinine, Ser: 0.91 mg/dL (ref 0.40–1.20)
GFR: 77.79 mL/min (ref >60.00)
Glucose, Bld: 70 mg/dL (ref 70–99)
Potassium: 4 mEq/L (ref 3.5–5.1)
Sodium: 137 mEq/L (ref 135–145)
Total Bilirubin: 0.6 mg/dL (ref 0.2–1.2)
Total Protein: 7.5 g/dL (ref 6.0–8.3)

## 2018-12-09 LAB — CBC
HCT: 46.9 % — ABNORMAL HIGH (ref 36.0–46.0)
Hemoglobin: 15.9 g/dL — ABNORMAL HIGH (ref 12.0–15.0)
MCHC: 33.9 g/dL (ref 30.0–36.0)
MCV: 88.9 fl (ref 78.0–100.0)
Platelets: 259 10*3/uL (ref 150.0–400.0)
RBC: 5.27 Mil/uL — ABNORMAL HIGH (ref 3.87–5.11)
RDW: 13.6 % (ref 11.5–15.5)
WBC: 6.2 10*3/uL (ref 4.0–10.5)

## 2018-12-09 LAB — LIPID PANEL
Cholesterol: 185 mg/dL (ref 0–200)
HDL: 69.2 mg/dL (ref >39.00)
LDL Cholesterol: 105 mg/dL — ABNORMAL HIGH (ref 0–99)
NonHDL: 116.07
Total CHOL/HDL Ratio: 3
Triglycerides: 54 mg/dL (ref 0.0–149.0)
VLDL: 10.8 mg/dL (ref 0.0–40.0)

## 2018-12-09 LAB — TSH: TSH: 1.12 u[IU]/mL (ref 0.35–4.50)

## 2018-12-10 ENCOUNTER — Encounter: Payer: Self-pay | Admitting: Internal Medicine

## 2018-12-10 NOTE — Progress Notes (Signed)
Subjective:    Patient ID: Victoria Brooks, female    DOB: 1996-10-15, 23 y.o.   MRN: [id number]  HPI  Pt presents to the clinic today for her annual exam.  Anxiety and Depression: We are currently still titrating her Effexor. She is up to 150 mg. She denies SI/HI.  Flu: never Tetanus: 12/2008 Pap Smear: never Dentist: biannually  Diet: She does eat meat. She consumes fruits and veggies daily. She does eat some fried foods. She drinks mostly water. Exercise: None  Review of Systems      Past Medical History:  Diagnosis Date  . Chicken pox     Current Outpatient Medications  Medication Sig Dispense Refill  . TRI-SPRINTEC 0.18/0.215/0.25 MG-35 MCG tablet Take 1 tablet by mouth daily.    Marland Kitchen venlafaxine XR (EFFEXOR XR) 150 MG 24 hr capsule Take 1 capsule (150 mg total) by mouth daily with breakfast. 30 capsule 2   No current facility-administered medications for this visit.     No Known Allergies  Family History  Problem Relation Age of Onset  . Heart disease Father   . Hypertension Maternal Uncle   . Cancer Maternal Grandmother        breast  . Alzheimer's disease Maternal Grandfather     Social History   Socioeconomic History  . Marital status: Single    Spouse name: Not on file  . Number of children: Not on file  . Years of education: Not on file  . Highest education level: Not on file  Occupational History  . Not on file  Social Needs  . Financial resource strain: Not on file  . Food insecurity:    Worry: Not on file    Inability: Not on file  . Transportation needs:    Medical: Not on file    Non-medical: Not on file  Tobacco Use  . Smoking status: Former Games developer  . Smokeless tobacco: Never Used  Substance and Sexual Activity  . Alcohol use: No  . Drug use: No  . Sexual activity: Never  Lifestyle  . Physical activity:    Days per week: Not on file    Minutes per session: Not on file  . Stress: Not on file  Relationships  . Social connections:     Talks on phone: Not on file    Gets together: Not on file    Attends religious service: Not on file    Active member of club or organization: Not on file    Attends meetings of clubs or organizations: Not on file    Relationship status: Not on file  . Intimate partner violence:    Fear of current or ex partner: Not on file    Emotionally abused: Not on file    Physically abused: Not on file    Forced sexual activity: Not on file  Other Topics Concern  . Not on file  Social History Narrative   Parents Divorced   Mom is custodial parent but, dad remains very involved   Brother 1 year older   Mom works for Orth[employer]     Constitutional: Denies fever, malaise, fatigue, headache or abrupt weight changes.  HEENT: Denies eye pain, eye redness, ear pain, ringing in the ears, wax buildup, runny nose, nasal congestion, bloody nose, or sore throat. Respiratory: Denies difficulty breathing, shortness of breath, cough or sputum production.   Cardiovascular: Denies chest pain, chest tightness, palpitations or swelling in the hands or feet.  Gastrointestinal: Denies abdominal  pain, bloating, constipation, diarrhea or blood in the stool.  GU: Denies urgency, frequency, pain with urination, burning sensation, blood in urine, odor or discharge. Musculoskeletal: Denies decrease in range of motion, difficulty with gait, muscle pain or joint pain and swelling.  Skin: Denies redness, rashes, lesions or ulcercations.  Neurological: Denies dizziness, difficulty with memory, difficulty with speech or problems with balance and coordination.  Psych: Pt has a history of anxiety and depression. Denies SI/HI.  No other specific complaints in a complete review of systems (except as listed in HPI above).  Objective:   Physical Exam    BP 112/76   Pulse (!) 102   Temp 98.2 F (36.8 C) (Oral)   Ht 5' 5.5" (1.664 m)   Wt 121 lb (54.9 kg)   LMP 11/30/2018   SpO2 98%   BMI 19.83  kg/m  Wt Readings from Last 3 Encounters:  12/08/18 121 lb (54.9 kg)  07/20/18 121 lb (54.9 kg)  10/07/17 136 lb (61.7 kg)    General: Appears her stated age, well developed, well nourished in NAD. Skin: Warm, dry and intact.  HEENT: Head: normal shape and size; Eyes: sclera white, no icterus, conjunctiva pink, PERRLA and EOMs intact; Ears: Tm's gray and intact, normal light reflex; Throat/Mouth: Teeth present, mucosa pink and moist, no exudate, lesions or ulcerations noted.  Neck:  Neck supple, trachea midline. No masses, lumps or thyromegaly present.  Cardiovascular: Normal rate and rhythm. S1,S2 noted.  No murmur, rubs or gallops noted. No JVD or BLE edema.  Pulmonary/Chest: Normal effort and positive vesicular breath sounds. No respiratory distress. No wheezes, rales or ronchi noted.  Abdomen: Soft and nontender. Normal bowel sounds. No distention or masses noted. Liver, spleen and kidneys non palpable. Musculoskeletal: Strength 5/5 BUE/BLE. No difficulty with gait.  Neurological: Alert and oriented. Cranial nerves II-XII grossly intact. Coordination normal.  Psychiatric: Mood and affect normal. Behavior is normal. Judgment and thought content normal.    BMET    Component Value Date/Time   NA 137 12/08/2018 1527   K 4.0 12/08/2018 1527   CL 101 12/08/2018 1527   CO2 29 12/08/2018 1527   GLUCOSE 70 12/08/2018 1527   BUN 12 12/08/2018 1527   CREATININE 0.91 12/08/2018 1527   CALCIUM 9.4 12/08/2018 1527    Lipid Panel     Component Value Date/Time   CHOL 185 12/08/2018 1527   TRIG 54.0 12/08/2018 1527   HDL 69.20 12/08/2018 1527   CHOLHDL 3 12/08/2018 1527   VLDL 10.8 12/08/2018 1527   LDLCALC 105 (H) 12/08/2018 1527    CBC    Component Value Date/Time   WBC 6.2 12/08/2018 1527   RBC 5.27 (H) 12/08/2018 1527   HGB 15.9 (H) 12/08/2018 1527   HCT 46.9 (H) 12/08/2018 1527   PLT 259.0 12/08/2018 1527   MCV 88.9 12/08/2018 1527   MCHC 33.9 12/08/2018 1527   RDW 13.6  12/08/2018 1527    Hgb A1C No results found for: HGBA1C        Assessment & Plan:   Preventative Health Maintenance:  Flu shot UTD She declines tetanus today She will call GYN to schedule her pap Encouraged her to consume a balanced diet and exercise regimen Advised her to see a dentist annually Will check CBC, CMET, LIpid, and TSH today  RTC in 1 year, sooner if needed Nicki Reaper, NP

## 2018-12-10 NOTE — Assessment & Plan Note (Signed)
Continue Effexor for another few weeks If no improvement, consider switching to something else.

## 2018-12-24 ENCOUNTER — Other Ambulatory Visit: Payer: Self-pay | Admitting: Internal Medicine

## 2019-01-02 ENCOUNTER — Encounter: Payer: Self-pay | Admitting: Internal Medicine

## 2019-03-01 ENCOUNTER — Other Ambulatory Visit: Payer: Self-pay | Admitting: Internal Medicine

## 2019-03-14 ENCOUNTER — Other Ambulatory Visit: Payer: Self-pay | Admitting: Internal Medicine

## 2019-03-17 ENCOUNTER — Encounter: Payer: Self-pay | Admitting: Internal Medicine

## 2019-03-18 ENCOUNTER — Telehealth: Payer: Self-pay

## 2019-03-18 NOTE — Telephone Encounter (Signed)
pts mom, Amy (DPR signed) said that pt had a gap of 4 - 5 days taking effexor.Amy is not sure if missing med like this would cause pt to be angry or not. Pt's mom said she is not supposed to know this but pt punched a mirror last night. pts mom thinks pt is depressed; pt made comments like feels hopeless, pt has no goals for future. Pt has no positives at this time.No SI/HI. Amy does not want pt to know that she called. Amy wants to know if we could call pt asking her if she got her med refilled and how is she doing.  pts mom thinks counseling would be beneficial for pt. Please advise. Can also call pts mom back as well.Please advise. [facility].

## 2019-03-18 NOTE — Telephone Encounter (Signed)
Mel- Can you call and make sure she is taking her meds, ask if she needs refill. Also ask if she would be willing to set up a video visit to follow up with me.

## 2019-03-19 ENCOUNTER — Encounter: Payer: Self-pay | Admitting: Internal Medicine

## 2019-04-14 ENCOUNTER — Encounter: Payer: Self-pay | Admitting: Internal Medicine

## 2019-05-05 ENCOUNTER — Telehealth: Payer: Self-pay

## 2019-05-05 NOTE — Telephone Encounter (Signed)
Pts mom (DPR signed) said pt had severe abd pain earlier,no abd pain now. Pt has had N & V today. Pt vomited after eating a breakfast biscuit and has not been able to keep food down today. Pt had been drinking but pt just vomited after drinking some soda. Pt is clammy now. Pt has chills after vomiting. pts mom does not think pt has fever now. Pt does not think she is pregnant; has not missed BCP. Pt is very miserable and uncomfortable. No diarrhea, fever, cough, S/T,SOB, muscle pain, H/a and no loss of taste;smell. No travel and no known exposure to covid. With pt feeling so badly and vomiting more advised pt's mom that she should go to UC for eval since not sure what could be causing N&V and severe abd pain earlier. pts mom said would discuss but does not want 04/26/19 9:30 virtual appt to be cancelled. FYI to Avie Echevaria NP.

## 2019-05-06 ENCOUNTER — Encounter: Payer: 59 | Admitting: Internal Medicine

## 2019-05-06 ENCOUNTER — Other Ambulatory Visit: Payer: Self-pay

## 2019-05-06 ENCOUNTER — Telehealth: Payer: Self-pay

## 2019-05-06 ENCOUNTER — Emergency Department: Payer: 59

## 2019-05-06 ENCOUNTER — Emergency Department
Admission: EM | Admit: 2019-05-06 | Discharge: 2019-05-06 | Disposition: A | Discharge status: Home / Self Care | Payer: 59 | Attending: Emergency Medicine | Admitting: Emergency Medicine

## 2019-05-06 DIAGNOSIS — Z79899 Other long term (current) drug therapy: Secondary | ICD-10-CM | POA: Insufficient documentation

## 2019-05-06 DIAGNOSIS — Z87891 Personal history of nicotine dependence: Secondary | ICD-10-CM | POA: Insufficient documentation

## 2019-05-06 DIAGNOSIS — O26899 Other specified pregnancy related conditions, unspecified trimester: Secondary | ICD-10-CM

## 2019-05-06 DIAGNOSIS — Z3A01 Less than 8 weeks gestation of pregnancy: Secondary | ICD-10-CM

## 2019-05-06 DIAGNOSIS — R109 Unspecified abdominal pain: Secondary | ICD-10-CM

## 2019-05-06 DIAGNOSIS — O210 Mild hyperemesis gravidarum: Secondary | ICD-10-CM | POA: Insufficient documentation

## 2019-05-06 DIAGNOSIS — R1010 Upper abdominal pain, unspecified: Secondary | ICD-10-CM | POA: Insufficient documentation

## 2019-05-06 LAB — URINALYSIS, COMPLETE (UACMP) WITH MICROSCOPIC
Bacteria, UA: NONE SEEN
Bilirubin Urine: NEGATIVE
Glucose, UA: 50 mg/dL — AB
Hgb urine dipstick: NEGATIVE
Ketones, ur: 5 mg/dL — AB
Leukocytes,Ua: NEGATIVE
Nitrite: NEGATIVE
Protein, ur: 100 mg/dL — AB
Specific Gravity, Urine: 1.029 (ref 1.005–1.030)
pH: 7 (ref 5.0–8.0)

## 2019-05-06 LAB — COMPREHENSIVE METABOLIC PANEL
ALT: 13 U/L (ref 0–44)
AST: 21 U/L (ref 15–41)
Albumin: 4.2 g/dL (ref 3.5–5.0)
Alkaline Phosphatase: 50 U/L (ref 38–126)
Anion gap: 9 (ref 5–15)
BUN: 10 mg/dL (ref 6–20)
CO2: 23 mmol/L (ref 22–32)
Calcium: 9.4 mg/dL (ref 8.9–10.3)
Chloride: 103 mmol/L (ref 98–111)
Creatinine, Ser: 0.68 mg/dL (ref 0.44–1.00)
GFR calc Af Amer: >60 mL/min (ref >60)
GFR calc non Af Amer: >60 mL/min (ref >60)
Glucose, Bld: 137 mg/dL — ABNORMAL HIGH (ref 70–99)
Potassium: 3.9 mmol/L (ref 3.5–5.1)
Sodium: 135 mmol/L (ref 135–145)
Total Bilirubin: 1 mg/dL (ref 0.3–1.2)
Total Protein: 7.8 g/dL (ref 6.5–8.1)

## 2019-05-06 LAB — CBC
HCT: 45.5 % (ref 36.0–46.0)
Hemoglobin: 15.7 g/dL — ABNORMAL HIGH (ref 12.0–15.0)
MCH: 30.4 pg (ref 26.0–34.0)
MCHC: 34.5 g/dL (ref 30.0–36.0)
MCV: 88 fL (ref 80.0–100.0)
Platelets: 247 10*3/uL (ref 150–400)
RBC: 5.17 MIL/uL — ABNORMAL HIGH (ref 3.87–5.11)
RDW: 13.3 % (ref 11.5–15.5)
WBC: 11.1 10*3/uL — ABNORMAL HIGH (ref 4.0–10.5)
nRBC: 0 % (ref 0.0–0.2)

## 2019-05-06 LAB — POCT PREGNANCY, URINE: Preg Test, Ur: POSITIVE — AB

## 2019-05-06 LAB — HCG, QUANTITATIVE, PREGNANCY: hCG, Beta Chain, Quant, S: 36822 m[IU]/mL — ABNORMAL HIGH (ref <5)

## 2019-05-06 LAB — LIPASE, BLOOD: Lipase: 26 U/L (ref 11–51)

## 2019-05-06 MED ORDER — PYRIDOXINE HCL 100 MG/ML IJ SOLN
50.0000 mg | Freq: Once | INTRAMUSCULAR | Status: AC
  Administered 2019-05-06: 20:00:00 50 mg via INTRAVENOUS
  Filled 2019-05-06: qty 0.5

## 2019-05-06 MED ORDER — PROMETHAZINE HCL 25 MG RE SUPP: 25.0000 mg | Freq: Four times a day (QID) | RECTAL | 0 refills | Status: DC | PRN

## 2019-05-06 MED ORDER — PROMETHAZINE HCL 25 MG/ML IJ SOLN
12.5000 mg | Freq: Once | INTRAMUSCULAR | Status: AC
  Administered 2019-05-06: 12.5 mg via INTRAVENOUS
  Filled 2019-05-06: qty 1

## 2019-05-06 MED ORDER — PRENATAL VITAMINS 28-0.8 MG PO TABS: 1.0000 | ORAL_TABLET | Freq: Every day | ORAL | 1 refills | Status: DC

## 2019-05-06 MED ORDER — METOCLOPRAMIDE HCL 5 MG/ML IJ SOLN
10.0000 mg | Freq: Once | INTRAMUSCULAR | Status: AC
  Administered 2019-05-06: 10 mg via INTRAVENOUS
  Filled 2019-05-06: qty 2

## 2019-05-06 MED ORDER — ONDANSETRON HCL 4 MG/2ML IJ SOLN
4.0000 mg | Freq: Once | INTRAMUSCULAR | Status: AC | PRN
  Administered 2019-05-06: 14:00:00 4 mg via INTRAVENOUS
  Filled 2019-05-06: qty 2

## 2019-05-06 MED ORDER — DIPHENHYDRAMINE HCL 50 MG/ML IJ SOLN
25.0000 mg | Freq: Once | INTRAMUSCULAR | Status: AC
  Administered 2019-05-06: 16:00:00 25 mg via INTRAVENOUS
  Filled 2019-05-06: qty 1

## 2019-05-06 MED ORDER — SODIUM CHLORIDE 0.9% FLUSH
3.0000 mL | Freq: Once | INTRAVENOUS | Status: AC
  Administered 2019-05-06: 3 mL via INTRAVENOUS

## 2019-05-06 MED ORDER — SODIUM CHLORIDE 0.9 % IV BOLUS
1000.0000 mL | Freq: Once | INTRAVENOUS | Status: AC
  Administered 2019-05-06: 14:00:00 1000 mL via INTRAVENOUS

## 2019-05-06 MED ORDER — ONDANSETRON HCL 4 MG/2ML IJ SOLN
4.0000 mg | Freq: Once | INTRAMUSCULAR | Status: AC
  Administered 2019-05-06: 20:00:00 4 mg via INTRAVENOUS
  Filled 2019-05-06: qty 2

## 2019-05-06 MED ORDER — ONDANSETRON 4 MG PO TBDP: 8.0000 mg | ORAL_TABLET | Freq: Three times a day (TID) | ORAL | 1 refills | Status: DC | PRN

## 2019-05-06 MED ORDER — METOCLOPRAMIDE HCL 10 MG PO TABS: 10.0000 mg | ORAL_TABLET | Freq: Four times a day (QID) | ORAL | 1 refills | Status: DC | PRN

## 2019-05-06 NOTE — Telephone Encounter (Signed)
[place] Night - Client Nonclinical Telephone Record AccessNurse Client [place] Night - Client Client Site [place] - Night Physician [facility] - PHYSICIAN, Verita Schneiders- MD Contact Type Call Who Is Calling Patient / Member / Family / Caregiver Caller Name Tiawah Phone Number [phone] Patient Name Patient DOB 06/09/1996 Call Type Message Only Information Provided Reason for Call Request to [facility] Appointment Initial Comment Caller states she needs to cancel appt for tomorrow Additional Comment Caller is just going to UC and will like to cancel 0930 tomorrow Call Closed By: Luiz Blare Transaction Date/

## 2019-05-06 NOTE — ED Triage Notes (Signed)
Pt sent [facility] for possible dehydration, states she has had N/V/D with abd cramping since yesterday morning pt is pale in color.

## 2019-05-06 NOTE — ED Provider Notes (Addendum)
[facility] Emergency Department Provider Note  ____________________________________________  Time seen: Approximately 2:38 PM  I have reviewed the triage vital signs and the nursing notes.   HISTORY  Chief Complaint Emesis    HPI Victoria Brooks is a 23 y.o. female that presents to the emergency department for evaluation of vomiting that started yesterday.  Patient had one episode of loose stools this morning.  She has had some mild upper cental abdominal discomfort from vomiting.  Last menstrual period was about 1 month ago.  [facility] coming to [facility] department and had one positive and 1 negative pregnancy test.  No recent illness.  No fevers, cough, shortness of breath, vaginal bleeding.   Past Medical History:  Diagnosis Date  . Chicken pox     Patient Active Problem List   Diagnosis Date Noted  . Anxiety and depression 07/23/2016    Past Surgical History:  Procedure Laterality Date  . ADENOIDECTOMY    . SHOULDER SURGERY Right   . TONSILLECTOMY    . WISDOM TOOTH EXTRACTION      Prior to Admission medications   Medication Sig Start Date End Date Taking? Authorizing Provider  ondansetron (ZOFRAN ODT) 4 MG disintegrating tablet Take 2 tablets (8 mg total) by mouth every 8 (eight) hours as needed for nausea or vomiting. 05/06/19   Laban Emperor, PA-C  Prenatal Vit-Fe Fumarate-FA (PRENATAL VITAMINS) 28-0.8 MG TABS Take 1 tablet by mouth daily. 05/06/19   Laban Emperor, PA-C  promethazine (PHENERGAN) 25 MG suppository Place 1 suppository (25 mg total) rectally every 6 (six) hours as needed for nausea or vomiting. 05/06/19   Laban Emperor, PA-C  TRI-SPRINTEC 0.18/0.215/0.25 MG-35 MCG tablet Take 1 tablet by mouth daily. 03/22/17   [provider]  venlafaxine XR (EFFEXOR-XR) 150 MG 24 hr capsule TAKE 1 CAPSULE (150 MG TOTAL) BY MOUTH DAILY WITH BREAKFAST. 03/18/19   Jearld Fenton, NP  metoCLOPramide (REGLAN) 10 MG  tablet Take 1 tablet (10 mg total) by mouth every 6 (six) hours as needed for nausea. 05/06/19 05/06/19  Laban Emperor, PA-C    Allergies Patient has no known allergies.  Family History  Problem Relation Age of Onset  . Heart disease Father   . Hypertension Maternal Uncle   . Cancer Maternal Grandmother        breast  . Alzheimer's disease Maternal Grandfather     Social History Social History   Tobacco Use  . Smoking status: Former Research scientist (life sciences)  . Smokeless tobacco: Never Used  Substance Use Topics  . Alcohol use: Yes  . Drug use: No     Review of Systems  Constitutional: No fever/chills Cardiovascular: No chest pain. Respiratory: No cough. No SOB. Gastrointestinal: No abdominal pain.  No nausea.  Positive for vomiting. Genitourinary: Negative for dysuria. Musculoskeletal: Negative for musculoskeletal pain. Skin: Negative for rash, abrasions, lacerations, ecchymosis. Neurological: Negative for headaches   ____________________________________________   PHYSICAL EXAM:  VITAL SIGNS: ED Triage Vitals  Enc Vitals Group     BP 05/06/19 1343 (!) 145/92     Pulse Rate 05/06/19 1343 78     Resp 05/06/19 1343 18     Temp 05/06/19 1346 97.8 F (36.6 C)     Temp Source 05/06/19 1343 Oral     SpO2 05/06/19 1343 100 %     Weight 05/06/19 1343 120 lb (54.4 kg)     Height 05/06/19 1343 5\' 6"  (1.676 m)     Head  Circumference --      Peak Flow --      Pain Score 05/06/19 1343 3     Pain Loc --      Pain Edu? --      Excl. in GC? --      Constitutional: Alert and oriented. Well appearing and in no acute distress. Eyes: Conjunctivae are normal. PERRL. EOMI. Head: Atraumatic. ENT:      Ears:      Nose: No congestion/rhinnorhea.      Mouth/Throat: Mucous membranes are moist.  Neck: No stridor.  Cardiovascular: Normal rate, regular rhythm.  Good peripheral circulation. Respiratory: Normal respiratory effort without tachypnea or retractions. Lungs CTAB. Good air entry to  the bases with no decreased or absent breath sounds. Gastrointestinal: Bowel sounds 4 quadrants. Soft and nontender to palpation. No guarding or rigidity. No palpable masses. No distention. Musculoskeletal: Full range of motion to all extremities. No gross deformities appreciated. Neurologic:  Normal speech and language. No gross focal neurologic deficits are appreciated.  Skin:  Skin is warm, dry and intact. No rash noted. Psychiatric: Mood and affect are normal. Speech and behavior are normal. Patient exhibits appropriate insight and judgement.   ____________________________________________   LABS (all labs ordered are listed, but only abnormal results are displayed)  Labs Reviewed  COMPREHENSIVE METABOLIC PANEL - Abnormal; Notable for the following components:      Result Value   Glucose, Bld 137 (*)    All other components within normal limits  CBC - Abnormal; Notable for the following components:   WBC 11.1 (*)    RBC 5.17 (*)    Hemoglobin 15.7 (*)    All other components within normal limits  URINALYSIS, COMPLETE (UACMP) WITH MICROSCOPIC - Abnormal; Notable for the following components:   Color, Urine YELLOW (*)    APPearance HAZY (*)    Glucose, UA 50 (*)    Ketones, ur 5 (*)    Protein, ur 100 (*)    All other components within normal limits  HCG, QUANTITATIVE, PREGNANCY - Abnormal; Notable for the following components:   hCG, Beta Chain, Mahalia LongestQuant, S [id number] (*)    All other components within normal limits  POCT PREGNANCY, URINE - Abnormal; Notable for the following components:   Preg Test, Ur POSITIVE (*)    All other components within normal limits  LIPASE, BLOOD  POC URINE PREG, ED   ____________________________________________  EKG   ____________________________________________  RADIOLOGY  [place] Ob Less Than 14 Weeks With Ob Transvaginal  Result Date: 05/06/2019 CLINICAL DATA:  Vomiting, abdominal pain EXAM: OBSTETRIC <14 WK US AND TRANSVAGINAL OB US  TECHNIQUE: Both transabdominal and transvaginal ultrasound examinations were performed for complete evaluation of the gestation as well as the maternal uterus, adnexal regions, and pelvic cul-de-sac. Transvaginal technique was performed to assess early pregnancy. COMPARISON:  None. FINDINGS: Intrauterine gestational sac: Single Yolk sac:  Visualized Embryo:  Visualized Cardiac Activity: Visualized Heart Rate: 92 bpm MSD: 12.2 mm   6 w   0 d CRL:  2.0 mm   5 w   5 d                  US EDC: 12/31/2019 Subchorionic hemorrhage:  None visualized. Maternal uterus/adnexae: No adnexal mass or free fluid. IMPRESSION: Five week 5 day intrauterine pregnancy by crown-rump length. Fetal bradycardia of 92 beats per minute. This may be related to early gestational age. This could be followed with repeat ultrasound in 10-14 days to ensure  expected progression. No acute maternal findings. Electronically Signed   By: Charlett NoseKevin  Dover M.D.   On: 05/06/2019 15:57    ____________________________________________    PROCEDURES  Procedure(s) performed:    Procedures    Medications  sodium chloride flush (NS) 0.9 % injection 3 mL (3 mLs Intravenous Given by Other 05/06/19 1427)  ondansetron (ZOFRAN) injection 4 mg (4 mg Intravenous Given 05/06/19 1404)  sodium chloride 0.9 % bolus 1,000 mL (0 mLs Intravenous Stopped 05/06/19 1800)  diphenhydrAMINE (BENADRYL) injection 25 mg (25 mg Intravenous Given 05/06/19 1609)  metoCLOPramide (REGLAN) injection 10 mg (10 mg Intravenous Given 05/06/19 1609)  promethazine (PHENERGAN) injection 12.5 mg (12.5 mg Intravenous Given 05/06/19 1750)  pyridOXINE (B-6) injection 50 mg (50 mg Intravenous Given 05/06/19 1945)  ondansetron (ZOFRAN) injection 4 mg (4 mg Intravenous Given 05/06/19 1936)     ____________________________________________   INITIAL IMPRESSION / ASSESSMENT AND PLAN / ED COURSE  Pertinent labs & imaging results that were available during my care of the patient were  reviewed by me and considered in my medical decision making (see chart for details).  Review of the [place] CSRS was performed in accordance of the [facility] prior to dispensing any controlled drugs.   Patient presented to emergency department for evaluation of vomiting for 2 days.  Vital signs and exam are reassuring.  Pregnancy test is positive.  Ultrasound consistent with intrauterine pregnancy about 5 weeks.  Ultrasound also comments on bradycardic HR at 92 bpm and recommends follow-up ultrasound in 10 to 14 days.  Patient received IV Zofran, IV Reglan, IV Benadryl, IV Phenergan in the emergency department and continued to vomit with oral challenge.  Dr. Elesa MassedWard with OB/GYN was consulted, updated on patient's case, and recommended an additional dose of IV Zofran, a dose of IV vitamin B6 with another oral challenge.  Following these medicines, patient felt significant relief and was able to eat crackers and drink ginger ale without any vomiting.  Electrolytes within normal limits.  She denies any abdominal pain.  Patient feels much better and would like to go home.  Dr. Elesa MassedWard recommends that patient be discharged home with Zofran every 8 hours, Phenergan suppositories every 6 hours and Reglan every 6 hours and to follow-up with her in clinic next week.  Patient will be discharged home with prescriptions for Zofran, Phenergan, Reglan, prenatal vitamins. Patient is to follow up with OB/GYN as directed.  Patient will follow-up next week.  Patient is given ED precautions to return to the ED for any worsening or new symptoms.   Kate SableHayley Mckelvie was evaluated in Emergency Department on 05/06/2019 for the symptoms described in the history of present illness. She was evaluated in the context of the global COVID-19 pandemic, which necessitated consideration that the patient might be at risk for infection with the SARS-CoV-2 virus that causes COVID-19. Institutional protocols and algorithms that pertain to the evaluation of patients  at risk for COVID-19 are in a state of rapid change based on information released by regulatory bodies including the CDC and federal and state organizations. These policies and algorithms were followed during the patient's care in the ED.  ____________________________________________  FINAL CLINICAL IMPRESSION(S) / ED DIAGNOSES  Final diagnoses:  Hyperemesis gravidarum  Less than [redacted] weeks gestation of pregnancy      NEW MEDICATIONS STARTED DURING THIS VISIT:  ED Discharge Orders         Ordered    ondansetron (ZOFRAN ODT) 4 MG disintegrating tablet  Every 8 hours PRN  05/06/19 2013    promethazine (PHENERGAN) 25 MG suppository  Every 6 hours PRN     05/06/19 2013    metoCLOPramide (REGLAN) 10 MG tablet  Every 6 hours PRN,   Status:  Discontinued     05/06/19 2013    Prenatal Vit-Fe Fumarate-FA (PRENATAL VITAMINS) 28-0.8 MG TABS  Daily     05/06/19 2013              This chart was dictated using voice recognition software/Dragon. Despite best efforts to proofread, errors can occur which can change the meaning. Any change was purely unintentional.    Enid DerryWagner, Aidenn Skellenger, PA-C 05/06/19 2037    Enid DerryWagner, Mariangela Heldt, PA-C 05/06/19 2038    Minna AntisPaduchowski, Kevin, MD 05/06/19 2240

## 2019-05-06 NOTE — Telephone Encounter (Signed)
Will discuss at upcoming appt.

## 2019-05-06 NOTE — Discharge Instructions (Addendum)
Your pregnancy test was positive and your ultrasound shows that you are about [redacted] weeks pregnant.  The fetal heart rate was a little slow on ultrasound, which could be because the pregnancy is so early or because you have been vomiting but will need to be rechecked in about 1 week.  I have given you some prescriptions for nausea and vomiting.  Please follow-up with Dr. Leonides Schanz next week for reevaluation.  Please return to the emergency department if you continue to vomit, develop abdominal pain or have vaginal bleeding.

## 2019-05-06 NOTE — Telephone Encounter (Signed)
noted 

## 2019-05-06 NOTE — ED Notes (Signed)
See triage note  Presents from [facility] with possible dehydration  States she developed some n/v with some abd cramping yesterday  Also had small amt of diarrhea  Denies any fever

## 2019-05-06 NOTE — ED Notes (Signed)
Pt requesting something to drink. Pt given the

## 2019-05-07 ENCOUNTER — Telehealth: Payer: Self-pay | Admitting: Internal Medicine

## 2019-05-07 ENCOUNTER — Ambulatory Visit: Payer: Self-pay

## 2019-05-07 ENCOUNTER — Encounter: Payer: Self-pay | Admitting: Emergency Medicine

## 2019-05-07 ENCOUNTER — Emergency Department
Admission: EM | Admit: 2019-05-07 | Discharge: 2019-05-07 | Disposition: A | Discharge status: Home / Self Care | Payer: 59 | Attending: Emergency Medicine | Admitting: Emergency Medicine

## 2019-05-07 ENCOUNTER — Other Ambulatory Visit: Payer: Self-pay

## 2019-05-07 DIAGNOSIS — Z5321 Procedure and treatment not carried out due to patient leaving prior to being seen by health care provider: Secondary | ICD-10-CM | POA: Diagnosis not present

## 2019-05-07 DIAGNOSIS — R1110 Vomiting, unspecified: Secondary | ICD-10-CM | POA: Diagnosis present

## 2019-05-07 NOTE — Telephone Encounter (Signed)
noted 

## 2019-05-07 NOTE — Telephone Encounter (Signed)
Pt seen in ED 05/06/19 for severe nausea/vomiting - pt is pregnant Mother left a VM this morning requesting a call back from [facility] as she has some questions/concerns regarding her daughters ED visit. Mother, Victoria Brooks, is on the DPR which was signed by the patient in 2017.  Please advise.

## 2019-05-07 NOTE — Progress Notes (Signed)
Erroneous encounter

## 2019-05-07 NOTE — Telephone Encounter (Signed)
Pt is currently back in ED at [facility] See other TE today.

## 2019-05-07 NOTE — Telephone Encounter (Signed)
They need to set up a virtual or in-office appointment to discuss.

## 2019-05-07 NOTE — Telephone Encounter (Signed)
Incoming call from Patient Mother with complaint of feeling nauseated and vomiting.  Patient is pregnant.  Onset was Wednesday.  Vomiting anywhere from 6 to 8 time per day.  .  Rated moderate to severe time per day.  Recommended that Patient return to Ed, to be  assessed for dehydration.  Mother voiced understanding.  Reviewed protocol and provided care advice.         Reason for Disposition . [1] Drinking very little AND [2] dehydration suspected (e.g., no urine > 12 hours, very dry mouth, very lightheaded)  Answer Assessment - Initial Assessment Questions 1. NAUSEA SEVERITY: "How bad is the nausea?" (e.g., mild, moderate, severe; dehydration, weight loss)   - MILD: loss of appetite without change in eating habits   - MODERATE: decreased oral intake without significant weight loss, dehydration, or malnutrition   - SEVERE: inadequate caloric or fluid intake, significant weight loss, symptoms of dehydration     Moderate  -Severe 2. ONSET: "When did the nausea begin?"     wednesday 3. VOMITING: "Any vomiting?" If so, ask: "How many times today?"     6 to 8 4. RECURRENT SYMPTOM: "Have you had nausea before?" If so, ask: "When was the last time?" "What happened that time?"      5. CAUSE: "What do you think is causing the nausea?"     preanacy 6. PREGNANCY: "Is there any chance you are pregnant?" (e.g., unprotected intercourse, missed birth control pill, broken condom)     yes  Protocols used: NAUSEA-A-AH

## 2019-05-07 NOTE — ED Triage Notes (Signed)
Patient reports she was seen yesterday for nausea and vomiting x2 days. Was found to be approx. [redacted] weeks pregnant. States she was sent home with zofran, however she continues to have nausea and vomiting and is unable to keep any food or drink down. Denies diarrhea or fever.

## 2019-05-08 ENCOUNTER — Emergency Department
Admission: EM | Admit: 2019-05-08 | Discharge: 2019-05-08 | Disposition: A | Discharge status: Home / Self Care | Payer: 59 | Attending: Emergency Medicine | Admitting: Emergency Medicine

## 2019-05-08 ENCOUNTER — Encounter: Payer: Self-pay | Admitting: Emergency Medicine

## 2019-05-08 ENCOUNTER — Other Ambulatory Visit: Payer: Self-pay

## 2019-05-08 DIAGNOSIS — E860 Dehydration: Secondary | ICD-10-CM | POA: Diagnosis not present

## 2019-05-08 DIAGNOSIS — O219 Vomiting of pregnancy, unspecified: Secondary | ICD-10-CM | POA: Diagnosis present

## 2019-05-08 DIAGNOSIS — Z3491 Encounter for supervision of normal pregnancy, unspecified, first trimester: Secondary | ICD-10-CM

## 2019-05-08 DIAGNOSIS — Z79899 Other long term (current) drug therapy: Secondary | ICD-10-CM | POA: Insufficient documentation

## 2019-05-08 DIAGNOSIS — Z3A01 Less than 8 weeks gestation of pregnancy: Secondary | ICD-10-CM | POA: Diagnosis not present

## 2019-05-08 LAB — HCG, QUANTITATIVE, PREGNANCY: hCG, Beta Chain, Quant, S: 46620 m[IU]/mL — ABNORMAL HIGH (ref <5)

## 2019-05-08 LAB — COMPREHENSIVE METABOLIC PANEL
ALT: 12 U/L (ref 0–44)
AST: 17 U/L (ref 15–41)
Albumin: 4.3 g/dL (ref 3.5–5.0)
Alkaline Phosphatase: 49 U/L (ref 38–126)
Anion gap: 10 (ref 5–15)
BUN: 11 mg/dL (ref 6–20)
CO2: 22 mmol/L (ref 22–32)
Calcium: 9.3 mg/dL (ref 8.9–10.3)
Chloride: 106 mmol/L (ref 98–111)
Creatinine, Ser: 0.7 mg/dL (ref 0.44–1.00)
GFR calc Af Amer: >60 mL/min (ref >60)
GFR calc non Af Amer: >60 mL/min (ref >60)
Glucose, Bld: 118 mg/dL — ABNORMAL HIGH (ref 70–99)
Potassium: 3.4 mmol/L — ABNORMAL LOW (ref 3.5–5.1)
Sodium: 138 mmol/L (ref 135–145)
Total Bilirubin: 1.1 mg/dL (ref 0.3–1.2)
Total Protein: 7.5 g/dL (ref 6.5–8.1)

## 2019-05-08 LAB — URINALYSIS, COMPLETE (UACMP) WITH MICROSCOPIC
Bacteria, UA: NONE SEEN
Bilirubin Urine: NEGATIVE
Glucose, UA: NEGATIVE mg/dL
Hgb urine dipstick: NEGATIVE
Ketones, ur: 20 mg/dL — AB
Leukocytes,Ua: NEGATIVE
Nitrite: NEGATIVE
Protein, ur: NEGATIVE mg/dL
Specific Gravity, Urine: 1.019 (ref 1.005–1.030)
pH: 7 (ref 5.0–8.0)

## 2019-05-08 LAB — CBC
HCT: 44.4 % (ref 36.0–46.0)
Hemoglobin: 15.6 g/dL — ABNORMAL HIGH (ref 12.0–15.0)
MCH: 30.4 pg (ref 26.0–34.0)
MCHC: 35.1 g/dL (ref 30.0–36.0)
MCV: 86.4 fL (ref 80.0–100.0)
Platelets: 303 10*3/uL (ref 150–400)
RBC: 5.14 MIL/uL — ABNORMAL HIGH (ref 3.87–5.11)
RDW: 13.1 % (ref 11.5–15.5)
WBC: 15.2 10*3/uL — ABNORMAL HIGH (ref 4.0–10.5)
nRBC: 0 % (ref 0.0–0.2)

## 2019-05-08 LAB — POCT PREGNANCY, URINE: Preg Test, Ur: POSITIVE — AB

## 2019-05-08 LAB — LIPASE, BLOOD: Lipase: 36 U/L (ref 11–51)

## 2019-05-08 MED ORDER — METOCLOPRAMIDE HCL 5 MG/ML IJ SOLN
10.0000 mg | Freq: Once | INTRAMUSCULAR | Status: AC
  Administered 2019-05-08: 10 mg via INTRAVENOUS
  Filled 2019-05-08: qty 2

## 2019-05-08 MED ORDER — METOCLOPRAMIDE HCL 10 MG PO TABS: 10.0000 mg | ORAL_TABLET | Freq: Four times a day (QID) | ORAL | 0 refills | Status: DC | PRN

## 2019-05-08 MED ORDER — FAMOTIDINE 20 MG PO TABS: 20.0000 mg | ORAL_TABLET | Freq: Two times a day (BID) | ORAL | 0 refills | Status: DC

## 2019-05-08 MED ORDER — DEXTROSE-NACL 5-0.45 % IV SOLN
Freq: Once | INTRAVENOUS | Status: AC
  Administered 2019-05-08: 09:00:00 via INTRAVENOUS

## 2019-05-08 MED ORDER — DIPHENHYDRAMINE HCL 50 MG/ML IJ SOLN
25.0000 mg | Freq: Once | INTRAMUSCULAR | Status: AC
  Administered 2019-05-08: 25 mg via INTRAVENOUS
  Filled 2019-05-08: qty 1

## 2019-05-08 NOTE — ED Triage Notes (Signed)
Patient states that she found out that she was [redacted] weeks pregnant here 2 days ago. Patient states that she has had nausea and vomiting since. Patient states that she has been taking the medication but that it is not helping with the nausea and vomiting.

## 2019-05-08 NOTE — ED Notes (Signed)
Pt verbalized understanding of discharge instructions. NAD at this time. 

## 2019-05-08 NOTE — ED Notes (Signed)
This EDT assisted pt with going to the bathroom.

## 2019-05-08 NOTE — ED Notes (Signed)
Pt states emesis from apple juice. Requested orange juice. Pt given OJ.

## 2019-05-08 NOTE — ED Notes (Signed)
Pt sitting in lobby in NAD, pt mother came in Ed to check on pt, pt started crying. Pt is currently sitting in chair in  NAD.

## 2019-05-08 NOTE — ED Notes (Signed)
Pt mother to front desk asking about wait time, pt mother was informed that pt is the next to go back unless something more emergent comes in.

## 2019-05-08 NOTE — ED Notes (Signed)
Pt not tolerating liquids and not wanting to eat crackers.

## 2019-05-08 NOTE — ED Notes (Signed)
Pt given apple juice and graham crackers for PO challenge 

## 2019-05-08 NOTE — ED Provider Notes (Signed)
[facility] Emergency Department Provider Note  ____________________________________________  Time seen: Approximately 8:41 AM  I have reviewed the triage vital signs and the nursing notes.   HISTORY  Chief Complaint Emesis    HPI Victoria Brooks is a 23 y.o. female with a past medical history of anxiety who comes to the ED  complaining of nausea and vomiting for the past 2 days, unable to take her medications or eat or drink much.  She denies any pain or shortness of breath.  No falls or syncope.  No unusual vaginal bleeding or discharge.  She is about [redacted] weeks along and indicates that she plans to have a termination.  She is taking prenatal vitamins.  Already has birth control pills as well.  She tried taking Zofran and Phenergan suppositories at home without relief.     Past Medical History:  Diagnosis Date  . Chicken pox      Patient Active Problem List   Diagnosis Date Noted  . Anxiety and depression 07/23/2016     Past Surgical History:  Procedure Laterality Date  . ADENOIDECTOMY    . SHOULDER SURGERY Right   . TONSILLECTOMY    . WISDOM TOOTH EXTRACTION       Prior to Admission medications   Medication Sig Start Date End Date Taking? Authorizing Provider  ondansetron (ZOFRAN ODT) 4 MG disintegrating tablet Take 2 tablets (8 mg total) by mouth every 8 (eight) hours as needed for nausea or vomiting. 05/06/19   Enid DerryWagner, Ashley, PA-C  Prenatal Vit-Fe Fumarate-FA (PRENATAL VITAMINS) 28-0.8 MG TABS Take 1 tablet by mouth daily. 05/06/19   Enid DerryWagner, Ashley, PA-C  promethazine (PHENERGAN) 25 MG suppository Place 1 suppository (25 mg total) rectally every 6 (six) hours as needed for nausea or vomiting. 05/06/19   Enid DerryWagner, Ashley, PA-C  TRI-SPRINTEC 0.18/0.215/0.25 MG-35 MCG tablet Take 1 tablet by mouth daily. 03/22/17   [provider]  venlafaxine XR (EFFEXOR-XR) 150 MG 24 hr capsule TAKE 1 CAPSULE (150 MG TOTAL) BY MOUTH DAILY WITH BREAKFAST. 03/18/19    Lorre MunroeBaity, Regina W, NP  metoCLOPramide (REGLAN) 10 MG tablet Take 1 tablet (10 mg total) by mouth every 6 (six) hours as needed for nausea. 05/06/19 05/06/19  Enid DerryWagner, Ashley, PA-C     Allergies Patient has no known allergies.   Family History  Problem Relation Age of Onset  . Heart disease Father   . Hypertension Maternal Uncle   . Cancer Maternal Grandmother        breast  . Alzheimer's disease Maternal Grandfather     Social History Social History   Tobacco Use  . Smoking status: Former Games developermoker  . Smokeless tobacco: Never Used  Substance Use Topics  . Alcohol use: Yes    Comment: occ  . Drug use: Yes    Types: Marijuana    Review of Systems  Constitutional:   No fever or chills.  ENT:   No sore throat. No rhinorrhea. Cardiovascular:   No chest pain or syncope. Respiratory:   No dyspnea or cough. Gastrointestinal:   Negative for abdominal pain, positive vomiting. Musculoskeletal:   Negative for focal pain or swelling All other systems reviewed and are negative except as documented above in ROS and HPI.  ____________________________________________   PHYSICAL EXAM:  VITAL SIGNS: ED Triage Vitals  Enc Vitals Group     BP 05/08/19 0437 112/76     Pulse Rate 05/08/19 0437 (!) 102     Resp 05/08/19 0437 20  Temp 05/08/19 0437 98 F (36.7 C)     Temp Source 05/08/19 0437 Oral     SpO2 05/08/19 0437 98 %     Weight 05/08/19 0438 121 lb (54.9 kg)     Height 05/08/19 0438 5\' 6"  (1.676 m)     Head Circumference --      Peak Flow --      Pain Score 05/08/19 0437 5     Pain Loc --      Pain Edu? --      Excl. in [place]? --     Vital signs reviewed, nursing assessments reviewed.   Constitutional:   Alert and oriented. Non-toxic appearance. Eyes:   Conjunctivae are normal. EOMI. PERRL. ENT      Head:   Normocephalic and atraumatic.      Nose:   No congestion/rhinnorhea.       Mouth/Throat:   MMM, no pharyngeal erythema. No peritonsillar mass.       Neck:   No  meningismus. Full ROM. Hematological/Lymphatic/Immunilogical:   No cervical lymphadenopathy. Cardiovascular:   RRR. Symmetric bilateral radial and DP pulses.  No murmurs. Cap refill less than 2 seconds. Respiratory:   Normal respiratory effort without tachypnea/retractions. Breath sounds are clear and equal bilaterally. No wheezes/rales/rhonchi. Gastrointestinal:   Soft with mild left upper quadrant tenderness. Non distended. There is no CVA tenderness.  No rebound, rigidity, or guarding. Musculoskeletal:   Normal range of motion in all extremities. No joint effusions.  No lower extremity tenderness.  No edema. Neurologic:   Normal speech and language.  Motor grossly intact. No acute focal neurologic deficits are appreciated.  Skin:    Skin is warm, dry and intact. No rash noted.  No petechiae, purpura, or bullae.  ____________________________________________    LABS (pertinent positives/negatives) (all labs ordered are listed, but only abnormal results are displayed) Labs Reviewed  COMPREHENSIVE METABOLIC PANEL - Abnormal; Notable for the following components:      Result Value   Potassium 3.4 (*)    Glucose, Bld 118 (*)    All other components within normal limits  CBC - Abnormal; Notable for the following components:   WBC 15.2 (*)    RBC 5.14 (*)    Hemoglobin 15.6 (*)    All other components within normal limits  URINALYSIS, COMPLETE (UACMP) WITH MICROSCOPIC - Abnormal; Notable for the following components:   Color, Urine YELLOW (*)    APPearance CLOUDY (*)    Ketones, ur 20 (*)    All other components within normal limits  POCT PREGNANCY, URINE - Abnormal; Notable for the following components:   Preg Test, Ur POSITIVE (*)    All other components within normal limits  LIPASE, BLOOD  HCG, QUANTITATIVE, PREGNANCY  POC URINE PREG, ED   ____________________________________________   EKG    ____________________________________________    RADIOLOGY  No results  found.  ____________________________________________   PROCEDURES Procedures  ____________________________________________  DIFFERENTIAL DIAGNOSIS   Morning sickness with dehydration, gastritis, anxiety  CLINICAL IMPRESSION / ASSESSMENT AND PLAN / ED COURSE  Medications ordered in the ED: Medications  dextrose 5 %-0.45 % sodium chloride infusion (has no administration in time range)  metoCLOPramide (REGLAN) injection 10 mg (has no administration in time range)  diphenhydrAMINE (BENADRYL) injection 25 mg (has no administration in time range)    Pertinent labs & imaging results that were available during my care of the patient were reviewed by me and considered in my medical decision making (see chart for details).  Victoria Brooks was evaluated in Emergency Department on 05/08/2019 for the symptoms described in the history of present illness. She was evaluated in the context of the global COVID-19 pandemic, which necessitated consideration that the patient might be at risk for infection with the SARS-CoV-2 virus that causes COVID-19. Institutional protocols and algorithms that pertain to the evaluation of patients at risk for COVID-19 are in a state of rapid change based on information released by regulatory bodies including the CDC and federal and state organizations. These policies and algorithms were followed during the patient's care in the ED.   Patient presents with nausea and vomiting in first trimester pregnancy that started after learning she was pregnant.  Patient agrees that she got herself "worked up" but does feel dehydrated.  No specific pain syndromes or other complaints, vital signs unremarkable, exam is reassuring other than supporting a suspicion of gastritis.  Will give IV fluids, Reglan and Benadryl for symptomatic relief.  She intends to terminate the pregnancy and is arranging this now with assistance from her mother.  She plans to continue oral birth control  pills.  ----------------------------------------- 1:39 PM on 05/08/2019 -----------------------------------------  Labs are reassuring, vital signs remain normal, she is tolerating oral intake and stable for discharge home.      ____________________________________________   FINAL CLINICAL IMPRESSION(S) / ED DIAGNOSES    Final diagnoses:  First trimester pregnancy  Nausea and vomiting during pregnancy prior to [redacted] weeks gestation  Dehydration     ED Discharge Orders    None      Portions of this note were generated with dragon dictation software. Dictation errors may occur despite best attempts at proofreading.   Sharman CheekStafford, Ji Fairburn, MD 05/08/19 1339

## 2019-05-08 NOTE — ED Notes (Signed)
Pt mother came to front desk stating that pt was saying she could not breath. Pt mother informed I would recheck pts vital signs but she needed to wait outside as we are still not allowing visitors in the waiting room.  Pts vital were rechecked and were WNL. Pt was instructed to take slow deep breaths in through her nose and out through her mouth. Pt also instructed that she needed to keep her mask up.

## 2019-05-08 NOTE — ED Notes (Signed)
Patient resting quietly with eyes closed in no acute distress.  

## 2019-05-10 ENCOUNTER — Encounter: Payer: Self-pay | Admitting: Internal Medicine

## 2019-05-10 ENCOUNTER — Ambulatory Visit (INDEPENDENT_AMBULATORY_CARE_PROVIDER_SITE_OTHER): Payer: 59 | Admitting: Internal Medicine

## 2019-05-10 ENCOUNTER — Other Ambulatory Visit: Payer: Self-pay

## 2019-05-10 ENCOUNTER — Telehealth: Payer: Self-pay

## 2019-05-10 VITALS — BP 120/66 | HR 123 | Temp 97.6°F | Wt 118.0 lb

## 2019-05-10 DIAGNOSIS — Z3A01 Less than 8 weeks gestation of pregnancy: Secondary | ICD-10-CM | POA: Diagnosis not present

## 2019-05-10 DIAGNOSIS — F419 Anxiety disorder, unspecified: Secondary | ICD-10-CM | POA: Diagnosis not present

## 2019-05-10 DIAGNOSIS — O211 Hyperemesis gravidarum with metabolic disturbance: Secondary | ICD-10-CM

## 2019-05-10 MED ORDER — DIAZEPAM 5 MG PO TABS: 5.0000 mg | ORAL_TABLET | Freq: Every day | ORAL | 0 refills | Status: DC

## 2019-05-10 NOTE — Telephone Encounter (Signed)
Patient seen in ER over the weekend and has had follow up today with PCP.

## 2019-05-10 NOTE — Patient Instructions (Signed)
Hyperemesis Gravidarum °Hyperemesis gravidarum is a severe form of nausea and vomiting that happens during pregnancy. Hyperemesis is worse than morning sickness. It may cause you to have nausea or vomiting all day for many days. It may keep you from eating and drinking enough food and liquids, which can lead to dehydration, malnutrition, and weight loss. Hyperemesis usually occurs during the first half (the first 20 weeks) of pregnancy. It often goes away once a woman is in her second half of pregnancy. However, sometimes hyperemesis continues through an entire pregnancy. °What are the causes? °The cause of this condition is not known. It may be related to changes in chemicals (hormones) in the body during pregnancy, such as the high level of pregnancy hormone (human chorionic gonadotropin) or the increase in the female sex hormone (estrogen). °What are the signs or symptoms? °Symptoms of this condition include: °· Nausea that does not go away. °· Vomiting that does not allow you to keep any food down. °· Weight loss. °· Body fluid loss (dehydration). °· Having no desire to eat, or not liking food that you have previously enjoyed. °How is this diagnosed? °This condition may be diagnosed based on: °· A physical exam. °· Your medical history. °· Your symptoms. °· Blood tests. °· Urine tests. °How is this treated? °This condition is managed by controlling symptoms. This may include: °· Following an eating plan. This can help lessen nausea and vomiting. °· Taking prescription medicines. °An eating plan and medicines are often used together to help control symptoms. If medicines do not help relieve nausea and vomiting, you may need to receive fluids through an IV at the hospital. °Follow these instructions at home: °Eating and drinking ° °· Avoid the following: °? Drinking fluids with meals. Try not to drink anything during the 30 minutes before and after your meals. °? Drinking more than 1 cup of fluid at a  time. °? Eating foods that trigger your symptoms. These may include spicy foods, coffee, high-fat foods, very sweet foods, and acidic foods. °? Skipping meals. Nausea can be more intense on an empty stomach. If you cannot tolerate food, do not force it. Try sucking on ice chips or other frozen items and make up for missed calories later. °? Lying down within 2 hours after eating. °? Being exposed to environmental triggers. These may include food smells, smoky rooms, closed spaces, rooms with strong smells, warm or humid places, overly loud and noisy rooms, and rooms with motion or flickering lights. Try eating meals in a well-ventilated area that is free of strong smells. °? Quick and sudden changes in your movement. °? Taking iron pills and multivitamins that contain iron. If you take prescription iron pills, do not stop taking them unless your health care provider approves. °? Preparing food. The smell of food can spoil your appetite or trigger nausea. °· To help relieve your symptoms: °? Listen to your body. Everyone is different and has different preferences. Find what works best for you. °? Eat and drink slowly. °? Eat 5-6 small meals daily instead of 3 large meals. Eating small meals and snacks can help you avoid an empty stomach. °? In the morning, before getting out of bed, eat a couple of crackers to avoid moving around on an empty stomach. °? Try eating starchy foods as these are usually tolerated well. Examples include cereal, toast, bread, potatoes, pasta, rice, and pretzels. °? Include at least 1 serving of protein with your meals and snacks. Protein options include   lean meats, poultry, seafood, beans, nuts, nut butters, eggs, cheese, and yogurt. °? Try eating a protein-rich snack before bed. Examples of a protein-rick snack include cheese and crackers or a peanut butter sandwich made with 1 slice of whole-wheat bread and 1 tsp (5 g) of peanut butter. °? Eat or suck on things that have ginger in them.  It may help relieve nausea. Add ¼ tsp ground ginger to hot tea or choose ginger tea. °? Try drinking 100% fruit juice or an electrolyte drink. An electrolyte drink contains sodium, potassium, and chloride. °? Drink fluids that are cold, clear, and carbonated or sour. Examples include lemonade, ginger ale, lemon-lime soda, ice water, and sparkling water. °? Brush your teeth or use a mouth rinse after meals. °? Talk with your health care provider about starting a supplement of vitamin B6. °General instructions °· Take over-the-counter and prescription medicines only as told by your health care provider. °· Follow instructions from your health care provider about eating or drinking restrictions. °· Continue to take your prenatal vitamins as told by your health care provider. If you are having trouble taking your prenatal vitamins, talk with your health care provider about different options. °· Keep all follow-up and pre-birth (prenatal) visits as told by your health care provider. This is important. °Contact a health care provider if: °· You have pain in your abdomen. °· You have a severe headache. °· You have vision problems. °· You are losing weight. °· You feel weak or dizzy. °Get help right away if: °· You cannot drink fluids without vomiting. °· You vomit blood. °· You have constant nausea and vomiting. °· You are very weak. °· You faint. °· You have a fever and your symptoms suddenly get worse. °Summary °· Hyperemesis gravidarum is a severe form of nausea and vomiting that happens during pregnancy. °· Making some changes to your eating habits may help relieve nausea and vomiting. °· This condition may be managed with medicine. °· If medicines do not help relieve nausea and vomiting, you may need to receive fluids through an IV at the hospital. °This information is not intended to replace advice given to you by your health care provider. Make sure you discuss any questions you have with your health care  provider. °Document Released: 10/07/2005 Document Revised: 10/27/2017 Document Reviewed: 06/05/2016 °Elsevier Patient Education © 2020 [employer] ° ° °

## 2019-05-10 NOTE — Progress Notes (Signed)
Subjective:    Patient ID: Victoria Brooks, female    DOB: Oct 11, 1996, 23 y.o.   MRN: [id number]  HPI  Patient presents today for a ER follow up on 7/16, 7/18 for hyperemesis gravidarum.  She got pregnant while taking her oral BC Pills.  She has been to the ED twice over the last 5 days for nausea and vomiting.  Patient states she is unable to keep anything down, including her maintenance medications.  Patient has been taking Zofran and Benadryl orally and Phenergan suppositories without much improvement in symptoms.  She states she is driving to [place] tomorrow to have the baby medically terminated. She is accompanied by her mother who is her main support person at this time.   Review of Systems  Past Medical History:  Diagnosis Date  . Chicken pox     Current Outpatient Medications  Medication Sig Dispense Refill  . famotidine (PEPCID) 20 MG tablet Take 1 tablet (20 mg total) by mouth 2 (two) times daily. 60 tablet 0  . metoCLOPramide (REGLAN) 10 MG tablet Take 1 tablet (10 mg total) by mouth every 6 (six) hours as needed. 30 tablet 0  . ondansetron (ZOFRAN ODT) 4 MG disintegrating tablet Take 2 tablets (8 mg total) by mouth every 8 (eight) hours as needed for nausea or vomiting. 24 tablet 1  . Prenatal Vit-Fe Fumarate-FA (PRENATAL VITAMINS) 28-0.8 MG TABS Take 1 tablet by mouth daily. 30 tablet 1  . TRI-SPRINTEC 0.18/0.215/0.25 MG-35 MCG tablet Take 1 tablet by mouth daily.    Marland Kitchen venlafaxine XR (EFFEXOR-XR) 150 MG 24 hr capsule TAKE 1 CAPSULE (150 MG TOTAL) BY MOUTH DAILY WITH BREAKFAST. 90 capsule 0   No current facility-administered medications for this visit.     No Known Allergies  Family History  Problem Relation Age of Onset  . Heart disease Father   . Hypertension Maternal Uncle   . Cancer Maternal Grandmother        breast  . Alzheimer's disease Maternal Grandfather     Social History   Socioeconomic History  . Marital status: Single    Spouse name: Not on file   . Number of children: Not on file  . Years of education: Not on file  . Highest education level: Not on file  Occupational History  . Not on file  Social Needs  . Financial resource strain: Not on file  . Food insecurity    Worry: Not on file    Inability: Not on file  . Transportation needs    Medical: Not on file    Non-medical: Not on file  Tobacco Use  . Smoking status: Former Research scientist (life sciences)  . Smokeless tobacco: Never Used  Substance and Sexual Activity  . Alcohol use: Yes    Comment: occ  . Drug use: Yes    Types: Marijuana  . Sexual activity: Not on file  Lifestyle  . Physical activity    Days per week: Not on file    Minutes per session: Not on file  . Stress: Not on file  Relationships  . Social Herbalist on phone: Not on file    Gets together: Not on file    Attends religious service: Not on file    Active member of club or organization: Not on file    Attends meetings of clubs or organizations: Not on file    Relationship status: Not on file  . Intimate partner violence    Fear of current or  ex partner: Not on file    Emotionally abused: Not on file    Physically abused: Not on file    Forced sexual activity: Not on file  Other Topics Concern  . Not on file  Social History Narrative   Parents Divorced   Mom is custodial parent but, dad remains very involved   Brother 1 year older   Mom works for [employer] insurance     Constitutional: Denies fever, malaise, fatigue, headache or abrupt weight changes.  Gastrointestinal: Complains of intractable nausea and vomiting.  Denies abdominal pain, bloating, constipation, diarrhea or blood in the stool.  Psych:  Complains of associated anxiety. Denies depression, SI/HI.  No other specific complaints in a complete review of systems (except as listed in HPI above).     Objective:   Physical Exam BP 120/66   Pulse (!) 123   Temp 97.6 F (36.4 C) (Temporal)   Wt 118 lb (53.5 kg)   LMP  03/31/2019 (Approximate)   SpO2 98%   BMI 19.05 kg/m    LMP 03/31/2019 (Approximate)  Wt Readings from Last 3 Encounters:  05/08/19 121 lb (54.9 kg)  05/06/19 120 lb (54.4 kg)  12/08/18 121 lb (54.9 kg)    General: Appears her stated age, pale. Skin: Warm, dry and intact.  Abdomen: Soft, nontender. Unable to palpate fundus. Psychiatric: Appears mildly anxious.  BMET    Component Value Date/Time   NA 138 05/08/2019 0441   K 3.4 (L) 05/08/2019 0441   CL 106 05/08/2019 0441   CO2 22 05/08/2019 0441   GLUCOSE 118 (H) 05/08/2019 0441   BUN 11 05/08/2019 0441   CREATININE 0.70 05/08/2019 0441   CALCIUM 9.3 05/08/2019 0441   GFRNONAA >60 05/08/2019 0441   GFRAA >60 05/08/2019 0441    Lipid Panel     Component Value Date/Time   CHOL 185 12/08/2018 1527   TRIG 54.0 12/08/2018 1527   HDL 69.20 12/08/2018 1527   CHOLHDL 3 12/08/2018 1527   VLDL 10.8 12/08/2018 1527   LDLCALC 105 (H) 12/08/2018 1527    CBC    Component Value Date/Time   WBC 15.2 (H) 05/08/2019 0441   RBC 5.14 (H) 05/08/2019 0441   HGB 15.6 (H) 05/08/2019 0441   HCT 44.4 05/08/2019 0441   PLT 303 05/08/2019 0441   MCV 86.4 05/08/2019 0441   MCH 30.4 05/08/2019 0441   MCHC 35.1 05/08/2019 0441   RDW 13.1 05/08/2019 0441    Hgb A1C No results found for: HGBA1C         Assessment & Plan:    ER Follow up for Pregnancy, Hyperemesis Gravidarum:  ER notes, labs reviewed Patient to terminate pregnancy tomorrow in [place] which will resolve this. Continue Zofran, Phenergan, and Benadryl for symptom management.  Anxiety:  Valium 5mg  PO PRN x 5 tablets prescribed for anxiety about upcoming procedure.  Instructed to return taking her Effexor once she is able after the procedure. Support offered today.  Return precautions discussed Nicki Reaperegina , NP

## 2019-05-10 NOTE — Telephone Encounter (Signed)
[facility] Night - Client TELEPHONE ADVICE RECORD [facility] Patient Name: CANDISS GALEANA Gender: Female DOB: [birth year] Age: 23 Y 50 M 25 D Return Phone Number: [phone] (Primary), [phone] (Secondary) Address: [place] [address] Client [facility] - Client Client Site [place] Physician Webb Silversmith - NP Contact Type Call Who Is Calling Patient / Member / Family / Caregiver Call Type Triage / Clinical Caller Name Amy Zigler Relationship To Patient Mother Return Phone Number [phone] (Primary) Chief Complaint pregnant (<20 weeks) - cramping, contractions, abdominal pain or pressure Reason for Call Symptomatic / Request for [place] states her daughter is [redacted] weeks pregnant and she was just diagnosed with hyperemesis gravida vomiting and she has alot of new medications and she is worried about med reactions. She has abdominal pain Translation No Nurse Assessment Nurse: Gareth Eagle, RN, Raquel Sarna Date/Time Eilene Ghazi Time): 05/08/2019 3:02:20 PM Confirm and document reason for call. If symptomatic, describe symptoms. ---Caller states dtr was seen at [facility] on Thursday and was sent to ED for vomiting, was given promethazine suppository RX, Zofran ODT, prenatal vitamins, and Reglan. However, Reglan was discontinued since she is on Effexor. Returned to ED this morning for continued vomiting and was given RX for famotidine and was told to ask her doctor about some of the medications but her dtr doesn't know which medications she is supposed to ask about. Has the patient had close contact with a person known or suspected to have the novel coronavirus illness OR traveled / lives in area with major community spread (including international travel) in the last 14 days from the onset of symptoms? * If Asymptomatic, screen for exposure and travel within the  last 14 days. ---No Does the patient have any new or worsening symptoms? ---Yes Will a triage be completed? ---Yes Related visit to physician within the last 2 weeks? ---Yes Does the PT have any chronic conditions? (i.e. diabetes, asthma, this includes High risk factors for pregnancy, etc.) ---No Is the patient pregnant or possibly pregnant? (Ask all females between the ages of 75-55) ---Yes How many weeks gestation? ---5 PLEASE NOTE: All timestamps contained within this report are represented as [place] Standard Time. CONFIDENTIALTY NOTICE: This fax transmission is intended only for the addressee. It contains information that is legally privileged, confidential or otherwise protected from use or disclosure. If you are not the intended recipient, you are strictly prohibited from reviewing, disclosing, copying using or disseminating any of this information or taking any action in reliance on or regarding this information. If you have received this fax in error, please notify us immediately by telephone so that we can arrange for its return to [place]. Phone: [phone], Toll-Free: [phone], Fax: [phone] Page: 2 of 2 Call Id: [id number] Nurse Assessment What is the estimated delivery date? ---0001-01-01 Total number of pregnancies including current? ---1 Number of live births? ---0 Have you felt decreased fetal movement? ---Early Pregnancy - No Fetal Movement Felt Yet Is this a behavioral health or substance abuse call? ---No Guidelines Guideline Title Affirmed Question Affirmed Notes Nurse Date/Time Eilene Ghazi Time) Pregnancy - Morning Sickness (Nausea and Vomiting of Pregnancy) [1] Unable to keep ANY liquids down (without vomiting) AND [2] present > 24 hours Theodoro Grist 05/08/2019 3:14:09 PM Disp. Time Eilene Ghazi Time) Disposition Final User 05/08/2019 3:25:52 PM See HCP within 4 Hours (or PCP triage) Yes Gareth Eagle, RN, Judeth Horn Disagree/Comply Comply Caller Understands  Yes PreDisposition Did  not know what to do Care Advice Given Per Guideline SEE HCP WITHIN 4 HOURS (OR PCP TRIAGE): * IF OFFICE WILL BE CLOSED AND NO PCP (PRIMARY CARE PROVIDER) SECOND-LEVEL TRIAGE: You need to be seen within the next 3 or 4 hours. A nearby Urgent Care Center [facility] is often a good source of care. Another choice is to go to the ED. Go sooner if you become worse. CLEAR FLUIDS: Take small sips of clear liquids every couple minutes. * Water or sports-rehydration liquid (Gatorade or Powerade) * Other options: 1/2 strength flat lemon-lime soda or ginger ale * Sip small amounts frequently. For example, 1 tablespoon (or 15 mL) every 5 minutes. CALL BACK IF: * You become worse. CARE ADVICE per Pregnancy - Morning Sickness (Adult) guideline. Comments User: Arnaldo NatalEmily, Carson, RN Date/Time (Eastern Time): 05/08/2019 3:25:45 PM Advised not to give Reglan since she is on Effexor due to risk of serotonin syndrome and since med was discontinued. https:// www.drugs.com/interactions-check.php?drug_list=2296-1524,1612-984 Advised that Phenergan, Zofran and famotidine may be given. Suggested starting with phenergan suppository first, then give Zofran 3-4hrs later if vomiting and nausea are not improved. Suggested holding prenatal vitamin while still vomiting but instructed to have dtr begin vitamin once vomiting is controlled. Advised to avoid NSAIDS for pain, use Tylenol instead. Referrals GO TO FACILITY UNDECIDED

## 2019-05-12 ENCOUNTER — Telehealth: Payer: Self-pay | Admitting: Internal Medicine

## 2019-05-12 NOTE — Telephone Encounter (Signed)
Patient's Mom Amy is requesting a call back in regards to a previous appt and a Procedure the patient had done yesterday . She stated she has a couple questions that she would like to ask .    Best C/B # [phone]

## 2019-05-14 NOTE — Telephone Encounter (Signed)
Pt's mother is aware and will talk with pt, if she feels that this needs to be addressed then she will schedule appt

## 2019-05-14 NOTE — Telephone Encounter (Signed)
This is probably all situational r/t recent ER visits and recent travel.  I would give it a bit more time for things to settle down.  But, I am happy to discuss this further if she wants to schedule an appt.

## 2019-05-14 NOTE — Telephone Encounter (Signed)
Pt's mother is concerned with more serious mental issues with pt ans she displays some bizarre behavior. She would like pt to get a psych eval for in the hopes of getting a true Dx and get pt the treatment she needs if meds need to be changed.... she states that she will talk to daughter and see if she can schedule an appt with you to discuss possible psych referral as she is going through this difficult time and current med is not doing well and hope that this will help her

## 2019-09-07 ENCOUNTER — Other Ambulatory Visit: Payer: Self-pay | Admitting: Internal Medicine

## 2019-09-10 ENCOUNTER — Telehealth: Payer: Self-pay

## 2019-09-10 MED ORDER — PROMETHAZINE HCL 12.5 MG RE SUPP: 12.5000 mg | Freq: Four times a day (QID) | RECTAL | 0 refills | Status: DC | PRN

## 2019-09-10 NOTE — Telephone Encounter (Signed)
Patient's mother called back.  I let her know the rx was called in to [facility] and she can get tested for Covid.  Patient's mother is asking to be called back because she has more questions.

## 2019-09-10 NOTE — Telephone Encounter (Signed)
Patient's mom, Amy, called and left message on triage line stating that patient is throwing up . No fever. Mom stated that probably should take patient to urgent care and was asking about COVID testing.   I called and left message to call back to discuss and see what they decided to do.

## 2019-09-10 NOTE — Telephone Encounter (Signed)
Can send in suppositories. Can go get COVID tested again if she would like.

## 2019-09-10 NOTE — Addendum Note (Signed)
Addended by: Jearld Fenton on: 09/10/2019 10:50 AM   Modules accepted: Orders

## 2019-09-10 NOTE — Telephone Encounter (Signed)
Spoke with patient's mom and patient. Patient started to throw up last night, some sore throat. No fever. She has thrown up about 3 to 4 times all together. Patient states she is feeling better today. She will try to slowly eat and drink some to see if she can keep it down. Patient and her mom asking for suppository anti nausea medication like they got in the summer. Advised patient and her mom that patient may need to go to ER/urgent Care but wanted to check with PCP first. Also want to know if she should get COVID testing.  Advised both of them if patient got worse before receiving a call back to head to ER right away.

## 2019-09-13 ENCOUNTER — Emergency Department
Admission: EM | Admit: 2019-09-13 | Discharge: 2019-09-13 | Disposition: A | Discharge status: Home / Self Care | Payer: 59 | Attending: Student in an Organized Health Care Education/Training Program | Admitting: Student in an Organized Health Care Education/Training Program

## 2019-09-13 ENCOUNTER — Other Ambulatory Visit: Payer: Self-pay

## 2019-09-13 ENCOUNTER — Encounter: Payer: Self-pay | Admitting: Emergency Medicine

## 2019-09-13 ENCOUNTER — Telehealth: Payer: Self-pay

## 2019-09-13 DIAGNOSIS — R112 Nausea with vomiting, unspecified: Secondary | ICD-10-CM | POA: Diagnosis not present

## 2019-09-13 DIAGNOSIS — Z79899 Other long term (current) drug therapy: Secondary | ICD-10-CM | POA: Insufficient documentation

## 2019-09-13 DIAGNOSIS — F1210 Cannabis abuse, uncomplicated: Secondary | ICD-10-CM | POA: Insufficient documentation

## 2019-09-13 DIAGNOSIS — R070 Pain in throat: Secondary | ICD-10-CM | POA: Diagnosis present

## 2019-09-13 DIAGNOSIS — R519 Headache, unspecified: Secondary | ICD-10-CM | POA: Insufficient documentation

## 2019-09-13 DIAGNOSIS — M7918 Myalgia, other site: Secondary | ICD-10-CM | POA: Insufficient documentation

## 2019-09-13 DIAGNOSIS — Z87891 Personal history of nicotine dependence: Secondary | ICD-10-CM | POA: Insufficient documentation

## 2019-09-13 LAB — COMPREHENSIVE METABOLIC PANEL
ALT: 30 U/L (ref 0–44)
AST: 24 U/L (ref 15–41)
Albumin: 4.2 g/dL (ref 3.5–5.0)
Alkaline Phosphatase: 47 U/L (ref 38–126)
Anion gap: 12 (ref 5–15)
BUN: 15 mg/dL (ref 6–20)
CO2: 27 mmol/L (ref 22–32)
Calcium: 9.2 mg/dL (ref 8.9–10.3)
Chloride: 95 mmol/L — ABNORMAL LOW (ref 98–111)
Creatinine, Ser: 0.83 mg/dL (ref 0.44–1.00)
GFR calc Af Amer: >60 mL/min (ref >60)
GFR calc non Af Amer: >60 mL/min (ref >60)
Glucose, Bld: 126 mg/dL — ABNORMAL HIGH (ref 70–99)
Potassium: 3.6 mmol/L (ref 3.5–5.1)
Sodium: 134 mmol/L — ABNORMAL LOW (ref 135–145)
Total Bilirubin: 0.8 mg/dL (ref 0.3–1.2)
Total Protein: 8.3 g/dL — ABNORMAL HIGH (ref 6.5–8.1)

## 2019-09-13 LAB — URINALYSIS, COMPLETE (UACMP) WITH MICROSCOPIC
Bilirubin Urine: NEGATIVE
Glucose, UA: NEGATIVE mg/dL
Hgb urine dipstick: NEGATIVE
Ketones, ur: NEGATIVE mg/dL
Nitrite: NEGATIVE
Protein, ur: 30 mg/dL — AB
Specific Gravity, Urine: 1.026 (ref 1.005–1.030)
pH: 5 (ref 5.0–8.0)

## 2019-09-13 LAB — CBC
HCT: 44.7 % (ref 36.0–46.0)
Hemoglobin: 16 g/dL — ABNORMAL HIGH (ref 12.0–15.0)
MCH: 29.4 pg (ref 26.0–34.0)
MCHC: 35.8 g/dL (ref 30.0–36.0)
MCV: 82 fL (ref 80.0–100.0)
Platelets: 319 10*3/uL (ref 150–400)
RBC: 5.45 MIL/uL — ABNORMAL HIGH (ref 3.87–5.11)
RDW: 13.1 % (ref 11.5–15.5)
WBC: 10.3 10*3/uL (ref 4.0–10.5)
nRBC: 0 % (ref 0.0–0.2)

## 2019-09-13 LAB — LIPASE, BLOOD: Lipase: 26 U/L (ref 11–51)

## 2019-09-13 LAB — POCT PREGNANCY, URINE: Preg Test, Ur: NEGATIVE

## 2019-09-13 MED ORDER — SODIUM CHLORIDE 0.9 % IV BOLUS
1000.0000 mL | Freq: Once | INTRAVENOUS | Status: AC
  Administered 2019-09-13: 14:00:00 1000 mL via INTRAVENOUS

## 2019-09-13 MED ORDER — SODIUM CHLORIDE 0.9% FLUSH: 3.0000 mL | Freq: Once | INTRAVENOUS | Status: DC

## 2019-09-13 MED ORDER — PROMETHAZINE HCL 12.5 MG RE SUPP: 12.5000 mg | Freq: Four times a day (QID) | RECTAL | 0 refills | Status: DC | PRN

## 2019-09-13 NOTE — Telephone Encounter (Signed)
Patient's mom called and left message on triage line stating that patient went to Urgent care yesterday-09/12/2019 and mom thinks she was tested for strep, flu and COVID? She was not sure of results yet. Patient went 12 to 24 hours with no more vomiting and patient just vomited again. Mom asked for a call back to discuss suggestions. (see first note from 09/10/2019 also) CB [phone] or [phone]

## 2019-09-13 NOTE — Telephone Encounter (Signed)
I spoke with Victoria Brooks; Victoria Brooks seen at [facility] in [place]; Victoria Brooks had strep,flu,covid. Victoria Brooks is worried about appendicitis and Victoria Brooks did home pregnancy test and that was inconclusive. Last menstrual period was about a wk ago. No fever. Victoria Brooks is very tired; Victoria Brooks does have dry mouth, Victoria Brooks has been vomiting for 3 days on and off; Victoria Brooks has abd pain on soreness in abd;no dizziness and last time urinated was earlier this morning and ws darker urine and smaller amt of urine. Victoria Brooks has not been able to keep any liquids down this morning. Victoria Brooks has not used supp this morning. Victoria Brooks is going to take Victoria Brooks to [facility] in [place] to be eval for appendicitis and dehydration. FYI to Avie Echevaria NP.

## 2019-09-13 NOTE — Telephone Encounter (Signed)
Agree with UC eval

## 2019-09-13 NOTE — ED Provider Notes (Signed)
[facility] Emergency Department Provider Note  ____________________________________________   None    (approximate)   I have reviewed the triage vital signs and the nursing notes.   Patient has been triaged with a MSE exam performed by myself at a minimum. Based on symptoms and screening exam, patient may receive a more in-depth exam, labs, imaging as detailed below. Patients have been advised of this setting and exam type at the time of patient interview.    HISTORY  Chief Complaint Sore Throat and Emesis    HPI Victoria Brooks is a 23 y.o. female presents to the emergency department with a complaint of nausea vomiting that started on Friday.  She did go for 24 hours without any vomiting and it started again this morning.  Took a home pregnancy test which did not even register a control.  Did state her last.  Was 1 to 2 weeks ago.  She did have mild sore throat last week.  Had a Covid test done yesterday but does not know the results.  No cough or congestion..   Patient will receive a medical screening exam as detailed below.  Based off of this exam, more in depth exam, labs, imaging will be performed as needed for complaint.  Patient care will be eventually transferred to another provider in the emergency department for final exam, diagnosis and disposition.    Past Medical History:  Diagnosis Date  . Chicken pox     Patient Active Problem List   Diagnosis Date Noted  . Anxiety and depression 07/23/2016    Past Surgical History:  Procedure Laterality Date  . ADENOIDECTOMY    . SHOULDER SURGERY Right   . TONSILLECTOMY    . WISDOM TOOTH EXTRACTION      Prior to Admission medications   Medication Sig Start Date End Date Taking? Authorizing Provider  diazepam (VALIUM) 5 MG tablet Take 1 tablet (5 mg total) by mouth at bedtime. 05/10/19   Lorre Munroe, NP  famotidine (PEPCID) 20 MG tablet Take 1 tablet (20 mg total) by mouth 2 (two) times  daily. 05/08/19   Sharman Cheek, MD  metoCLOPramide (REGLAN) 10 MG tablet Take 1 tablet (10 mg total) by mouth every 6 (six) hours as needed. 05/08/19   Sharman Cheek, MD  ondansetron (ZOFRAN ODT) 4 MG disintegrating tablet Take 2 tablets (8 mg total) by mouth every 8 (eight) hours as needed for nausea or vomiting. 05/06/19   Enid Derry, PA-C  Prenatal Vit-Fe Fumarate-FA (PRENATAL VITAMINS) 28-0.8 MG TABS Take 1 tablet by mouth daily. Patient not taking: Reported on 05/10/2019 05/06/19   Enid Derry, PA-C  promethazine (PHENERGAN) 12.5 MG suppository Place 1 suppository (12.5 mg total) rectally every 6 (six) hours as needed for nausea or vomiting. 09/10/19   Lorre Munroe, NP  venlafaxine XR (EFFEXOR-XR) 150 MG 24 hr capsule TAKE 1 CAPSULE (150 MG TOTAL) BY MOUTH DAILY WITH BREAKFAST. 09/07/19   Lorre Munroe, NP    Allergies Patient has no known allergies.  Family History  Problem Relation Age of Onset  . Heart disease Father   . Hypertension Maternal Uncle   . Cancer Maternal Grandmother        breast  . Alzheimer's disease Maternal Grandfather     Social History Social History   Tobacco Use  . Smoking status: Former Games developer  . Smokeless tobacco: Never Used  Substance Use Topics  . Alcohol use: Yes    Comment: occ  . Drug  use: Yes    Types: Marijuana    Review of Systems Constitutional: Denies fever ENT: Denies nasal congestion/rhinorhea.  Complains of sore throat Cardiovascular: Denies chest pain. Respiratory: Denies cough.  Denies shortness of breath/difficulty breathing Gastroenterology: Nausea and vomiting, no abdominal pain Musculoskeletal: Denies for musculoskeletal pain Integumentary: Negative for rash. Neurological: No focal weakness nor numbness.   ____________________________________________   PHYSICAL EXAM:  VITAL SIGNS: ED Triage Vitals [09/13/19 1318]  Enc Vitals Group     BP      Pulse Rate 70     Resp 16     Temp 97.6 F (36.4 C)      Temp Source Oral     SpO2 98 %     Weight 130 lb (59 kg)     Height 5\' 6"  (1.676 m)     Head Circumference      Peak Flow      Pain Score 0     Pain Loc      Pain Edu?      Excl. in [place]?     Constitutional: Alert and oriented. Generally well appearing and in no acute distress. Eyes: Conjunctivae are normal.  Nose: No significant congestion/rhinnorhea. Mouth: No gross oropharyngeal edema.  Neck: No stridor.  No meningeal signs.   Cardiovascular: Grossly normal heart sounds. Respiratory: Normal respiratory effort without significant tachypnea and no observed retractions. Lungs CTA Gastrointestinal: No significant visible abdominal wall findings.  Bowel sounds x4 quadrants.  Nontender to palpation. Musculoskeletal: No gross deformities of extremities. Neurologic:  Normal speech and language. No gross focal neurologic deficits are appreciated.  Skin:  Skin is warm, dry and intact. No rash noted.    ____________________________________________   LABS (all labs ordered are listed, but only abnormal results are displayed)  Labs Reviewed  LIPASE, BLOOD  COMPREHENSIVE METABOLIC PANEL  CBC  URINALYSIS, COMPLETE (UACMP) WITH MICROSCOPIC  POC URINE PREG, ED    ____________________________________________   RADIOLOGY   Official radiology report(s): No results found.  ____________________________________________    INITIAL IMPRESSION / MDM / ASSESSMENT AND PLAN / ED COURSE  As part of my medical decision making, I reviewed the following data within the [place] notes reviewed and incorporated, Notes from prior ED visits and [place] Controlled Substance Database      Clinical Impression: Nausea/vomiting    Patient has been screened based based on their arrival complaint, evaluated for an emergent condition, and at a minimum has received a medical screening exam.  At this time, patient will receive further work-up as determined by medical  screening exam.  Patient care will eventually be transferred to another provider in the emergency department for final diagnosis and disposition.    ____________________________________________  Note:  This document was prepared using Systems analyst and may include unintentional dictation errors.    Versie Starks, PA-C 09/13/19 1334    Earleen Newport, MD 09/13/19 [phone]

## 2019-09-13 NOTE — ED Provider Notes (Signed)
[facility] Emergency Department Provider Note    First MD Initiated Contact with Patient 09/13/19 1524     (approximate)  I have reviewed the triage vital signs and the nursing notes.   HISTORY  Chief Complaint Sore Throat and Emesis    HPI Kate SableHayley Caporale is a 23 y.o. female presents the ER for evaluation of concerns of dehydration.  Patient states that she had case of sore throat headache myalgias and nausea.  States that the headache sore throat has resolved but she has been having intermittent nausea and vomiting for the past several days.  She does smoke marijuana but states that this makes her feel better.  Denies any abdominal pain.  No diarrhea.  No dysuria or discharge reported.  Denies any chest pain or shortness of breath.  Did have outpatient strep test as well as Covid test done yesterday.  Strep was negative.  Covid still pending.    Past Medical History:  Diagnosis Date   Chicken pox    Family History  Problem Relation Age of Onset   Heart disease Father    Hypertension Maternal Uncle    Cancer Maternal Grandmother        breast   Alzheimer's disease Maternal Grandfather    Past Surgical History:  Procedure Laterality Date   ADENOIDECTOMY     SHOULDER SURGERY Right    TONSILLECTOMY     WISDOM TOOTH EXTRACTION     Patient Active Problem List   Diagnosis Date Noted   Anxiety and depression 07/23/2016      Prior to Admission medications   Medication Sig Start Date End Date Taking? Authorizing Provider  diazepam (VALIUM) 5 MG tablet Take 1 tablet (5 mg total) by mouth at bedtime. 05/10/19   Lorre MunroeBaity, Regina W, NP  famotidine (PEPCID) 20 MG tablet Take 1 tablet (20 mg total) by mouth 2 (two) times daily. 05/08/19   Sharman CheekStafford, Phillip, MD  metoCLOPramide (REGLAN) 10 MG tablet Take 1 tablet (10 mg total) by mouth every 6 (six) hours as needed. 05/08/19   Sharman CheekStafford, Phillip, MD  ondansetron (ZOFRAN ODT) 4 MG disintegrating tablet  Take 2 tablets (8 mg total) by mouth every 8 (eight) hours as needed for nausea or vomiting. 05/06/19   Enid DerryWagner, Ashley, PA-C  Prenatal Vit-Fe Fumarate-FA (PRENATAL VITAMINS) 28-0.8 MG TABS Take 1 tablet by mouth daily. Patient not taking: Reported on 05/10/2019 05/06/19   Enid DerryWagner, Ashley, PA-C  promethazine (PHENERGAN) 12.5 MG suppository Place 1 suppository (12.5 mg total) rectally every 6 (six) hours as needed for nausea or vomiting. 09/13/19   Willy Eddyobinson, Haevyn Ury, MD  venlafaxine XR (EFFEXOR-XR) 150 MG 24 hr capsule TAKE 1 CAPSULE (150 MG TOTAL) BY MOUTH DAILY WITH BREAKFAST. 09/07/19   Lorre MunroeBaity, Regina W, NP    Allergies Patient has no known allergies.    Social History Social History   Tobacco Use   Smoking status: Former Smoker   Smokeless tobacco: Never Used  Substance Use Topics   Alcohol use: Yes    Comment: occ   Drug use: Yes    Types: Marijuana    Review of Systems Patient denies headaches, rhinorrhea, blurry vision, numbness, shortness of breath, chest pain, edema, cough, abdominal pain, nausea, vomiting, diarrhea, dysuria, fevers, rashes or hallucinations unless otherwise stated above in HPI. ____________________________________________   PHYSICAL EXAM:  VITAL SIGNS: Vitals:   09/13/19 1318  Pulse: 70  Resp: 16  Temp: 97.6 F (36.4 C)  SpO2: 98%    Constitutional: Alert  and oriented.  Eyes: Conjunctivae are normal.  Head: Atraumatic. Nose: No congestion/rhinnorhea. Mouth/Throat: Mucous membranes are moist.   Neck: No stridor. Painless ROM.  Cardiovascular: Normal rate, regular rhythm. Grossly normal heart sounds.  Good peripheral circulation. Respiratory: Normal respiratory effort.  No retractions. Lungs CTAB. Gastrointestinal: Soft and nontender in all four quadrants No distention. No abdominal bruits. No CVA tenderness. Genitourinary:  Musculoskeletal: No lower extremity tenderness nor edema.  No joint effusions. Neurologic:  Normal speech and language.  No gross focal neurologic deficits are appreciated. No facial droop Skin:  Skin is warm, dry and intact. No rash noted. Psychiatric: Mood and affect are normal. Speech and behavior are normal.  ____________________________________________   LABS (all labs ordered are listed, but only abnormal results are displayed)  Results for orders placed or performed during the hospital encounter of 09/13/19 (from the past 24 hour(s))  Lipase, blood     Status: None   Collection Time: 09/13/19  1:31 PM  Result Value Ref Range   Lipase 26 11 - 51 U/L  Comprehensive metabolic panel     Status: Abnormal   Collection Time: 09/13/19  1:31 PM  Result Value Ref Range   Sodium 134 (L) 135 - 145 mmol/L   Potassium 3.6 3.5 - 5.1 mmol/L   Chloride 95 (L) 98 - 111 mmol/L   CO2 27 22 - 32 mmol/L   Glucose, Bld 126 (H) 70 - 99 mg/dL   BUN 15 6 - 20 mg/dL   Creatinine, Ser 0.83 0.44 - 1.00 mg/dL   Calcium 9.2 8.9 - 10.3 mg/dL   Total Protein 8.3 (H) 6.5 - 8.1 g/dL   Albumin 4.2 3.5 - 5.0 g/dL   AST 24 15 - 41 U/L   ALT 30 0 - 44 U/L   Alkaline Phosphatase 47 38 - 126 U/L   Total Bilirubin 0.8 0.3 - 1.2 mg/dL   GFR calc non Af Amer >60 >60 mL/min   GFR calc Af Amer >60 >60 mL/min   Anion gap 12 5 - 15  CBC     Status: Abnormal   Collection Time: 09/13/19  1:31 PM  Result Value Ref Range   WBC 10.3 4.0 - 10.5 K/uL   RBC 5.45 (H) 3.87 - 5.11 MIL/uL   Hemoglobin 16.0 (H) 12.0 - 15.0 g/dL   HCT 44.7 36.0 - 46.0 %   MCV 82.0 80.0 - 100.0 fL   MCH 29.4 26.0 - 34.0 pg   MCHC 35.8 30.0 - 36.0 g/dL   RDW 13.1 11.5 - 15.5 %   Platelets 319 150 - 400 K/uL   nRBC 0.0 0.0 - 0.2 %  Urinalysis, Complete w Microscopic     Status: Abnormal   Collection Time: 09/13/19  1:31 PM  Result Value Ref Range   Color, Urine YELLOW (A) YELLOW   APPearance TURBID (A) CLEAR   Specific Gravity, Urine 1.026 1.005 - 1.030   pH 5.0 5.0 - 8.0   Glucose, UA NEGATIVE NEGATIVE mg/dL   Hgb urine dipstick NEGATIVE NEGATIVE    Bilirubin Urine NEGATIVE NEGATIVE   Ketones, ur NEGATIVE NEGATIVE mg/dL   Protein, ur 30 (A) NEGATIVE mg/dL   Nitrite NEGATIVE NEGATIVE   Leukocytes,Ua SMALL (A) NEGATIVE   RBC / HPF 0-5 0 - 5 RBC/hpf   WBC, UA 6-10 0 - 5 WBC/hpf   Bacteria, UA RARE (A) NONE SEEN   Squamous Epithelial / LPF 21-50 0 - 5   Mucus PRESENT    Non Squamous  Epithelial PRESENT (A) NONE SEEN  Pregnancy, urine POC     Status: None   Collection Time: 09/13/19  2:32 PM  Result Value Ref Range   Preg Test, Ur NEGATIVE NEGATIVE   ____________________________________________ ____________________________________________  RADIOLOGY  I personally reviewed all radiographic images ordered to evaluate for the above acute complaints and reviewed radiology reports and findings.  These findings were personally discussed with the patient.  Please see medical record for radiology report.  ____________________________________________   PROCEDURES  Procedure(s) performed:  Procedures    Critical Care performed: no ____________________________________________   INITIAL IMPRESSION / ASSESSMENT AND PLAN / ED COURSE  Pertinent labs & imaging results that were available during my care of the patient were reviewed by me and considered in my medical decision making (see chart for details).   DDX: dehdyration, electroltte abn, uri, covid, flu like illness, uti,  Jaylnn Ullery is a 23 y.o. who presents to the ED with symptoms as described above.  Patient nontoxic-appearing.  Given IV fluids with significant improvement.  Abdominal exam soft and benign.  Blood work is reassuring.  Do not feel that urgent or emergent imaging is clinically indicated at this time given his presentation.  Will give refill of Phenergan.  Discussed signs and symptoms which he should return to the ER.  Have discussed with the patient and available family all diagnostics and treatments performed thus far and all questions were answered to the best of my  ability. The patient demonstrates understanding and agreement with plan. c     The patient was evaluated in Emergency Department today for the symptoms described in the history of present illness. He/she was evaluated in the context of the global COVID-19 pandemic, which necessitated consideration that the patient might be at risk for infection with the SARS-CoV-2 virus that causes COVID-19. Institutional protocols and algorithms that pertain to the evaluation of patients at risk for COVID-19 are in a state of rapid change based on information released by regulatory bodies including the CDC and federal and state organizations. These policies and algorithms were followed during the patient's care in the ED.  As part of my medical decision making, I reviewed the following data within the electronic MEDICAL RECORD NUMBER Nursing notes reviewed and incorporated, Labs reviewed, notes from prior ED visits and [place] Controlled Substance Database   ____________________________________________   FINAL CLINICAL IMPRESSION(S) / ED DIAGNOSES  Final diagnoses:  Non-intractable vomiting with nausea, unspecified vomiting type      NEW MEDICATIONS STARTED DURING THIS VISIT:  Current Discharge Medication List       Note:  This document was prepared using Dragon voice recognition software and may include unintentional dictation errors.    Willy Eddy, MD 09/13/19 [phone]

## 2019-09-13 NOTE — ED Triage Notes (Signed)
Pt here with c/o sore throat mid last week, then began vomiting on Friday, states vomiting stopped for 24 hours then stopped again, feels dehydrated today. Was covid swabbed yesterday in [place] with no results at this time, denies fever, appears pale.

## 2019-09-14 ENCOUNTER — Telehealth: Payer: Self-pay | Admitting: Internal Medicine

## 2019-09-14 NOTE — Telephone Encounter (Signed)
Mom called stating pt went to er yesterday for vomiting they wanted her to follow up with her PCP and get a referral for GI.  Mom didn't want to schedule appointment with Rollene Fare  The ER gave her a GI dr name Marius Ditch ( they could not find anything wrong)     Mom is going to call Dr Marius Ditch office to see if she can make an appointment without referral from [facility]

## 2019-09-14 NOTE — Telephone Encounter (Signed)
Patient's mom called back and stated that they did not need a referral  So they will go ahead and schedule an appointment with them

## 2019-09-23 DIAGNOSIS — M419 Scoliosis, unspecified: Secondary | ICD-10-CM | POA: Insufficient documentation

## 2019-10-24 ENCOUNTER — Other Ambulatory Visit: Payer: Self-pay | Admitting: Internal Medicine

## 2019-11-16 ENCOUNTER — Ambulatory Visit: Payer: 59 | Admitting: Gastroenterology

## 2019-12-26 ENCOUNTER — Ambulatory Visit: Payer: 59 | Attending: Internal Medicine

## 2019-12-26 DIAGNOSIS — Z23 Encounter for immunization: Secondary | ICD-10-CM

## 2019-12-26 NOTE — Progress Notes (Signed)
   Covid-19 Vaccination Clinic  Name:  Victoria Brooks    MRN: [id number] DOB: 12-04-1995  12/26/2019  [name] was observed post Covid-19 immunization for 15 minutes without incident. She was provided with Vaccine Information Sheet and instruction to access the V-Safe system.   [name] was instructed to call 911 with any severe reactions post vaccine: Marland Kitchen Difficulty breathing  . Swelling of face and throat  . A fast heartbeat  . A bad rash all over body  . Dizziness and weakness   Immunizations Administered    Name Date Dose VIS Date Route   Pfizer COVID-19 Vaccine 12/26/2019  4:42 PM 0.3 mL 10/01/2019 Intramuscular   Manufacturer: [employer], [employer]   Lot: [id number]   NDC: [address]-6

## 2020-01-03 ENCOUNTER — Other Ambulatory Visit: Payer: Self-pay | Admitting: Internal Medicine

## 2020-01-04 ENCOUNTER — Encounter: Payer: Self-pay | Admitting: Internal Medicine

## 2020-01-05 MED ORDER — VENLAFAXINE HCL ER 150 MG PO CP24: 150.0000 mg | ORAL_CAPSULE | Freq: Every day | ORAL | 0 refills | Status: DC

## 2020-01-18 ENCOUNTER — Ambulatory Visit: Payer: 59 | Attending: Internal Medicine

## 2020-01-18 DIAGNOSIS — Z23 Encounter for immunization: Secondary | ICD-10-CM

## 2020-01-18 NOTE — Progress Notes (Signed)
   Covid-19 Vaccination Clinic  Name:  Victoria Brooks    MRN: [id number] DOB: April 27, 1996  01/18/2020  [name] was observed post Covid-19 immunization for 15 minutes without incident. She was provided with Vaccine Information Sheet and instruction to access the V-Safe system.   [name] was instructed to call 911 with any severe reactions post vaccine: Marland Kitchen Difficulty breathing  . Swelling of face and throat  . A fast heartbeat  . A bad rash all over body  . Dizziness and weakness   Immunizations Administered    Name Date Dose VIS Date Route   Pfizer COVID-19 Vaccine 01/18/2020 11:19 AM 0.3 mL 10/01/2019 Intramuscular   Manufacturer: [employer], [employer]   Lot: [id number]   NDC: [address]-7

## 2020-02-10 ENCOUNTER — Encounter: Payer: Self-pay | Admitting: Internal Medicine

## 2020-02-10 ENCOUNTER — Other Ambulatory Visit: Payer: Self-pay

## 2020-02-10 ENCOUNTER — Ambulatory Visit (INDEPENDENT_AMBULATORY_CARE_PROVIDER_SITE_OTHER): Payer: 59 | Admitting: Internal Medicine

## 2020-02-10 ENCOUNTER — Other Ambulatory Visit (HOSPITAL_COMMUNITY)
Admission: RE | Admit: 2020-02-10 | Discharge: 2020-02-10 | Disposition: A | Discharge status: Home / Self Care | Payer: 59 | Source: Ambulatory Visit | Attending: Internal Medicine | Admitting: Internal Medicine

## 2020-02-10 VITALS — BP 108/74 | HR 83 | Temp 97.8°F | Ht 65.75 in | Wt 134.0 lb

## 2020-02-10 DIAGNOSIS — F32A Depression, unspecified: Secondary | ICD-10-CM

## 2020-02-10 DIAGNOSIS — F419 Anxiety disorder, unspecified: Secondary | ICD-10-CM

## 2020-02-10 DIAGNOSIS — Z124 Encounter for screening for malignant neoplasm of cervix: Secondary | ICD-10-CM | POA: Diagnosis present

## 2020-02-10 DIAGNOSIS — F329 Major depressive disorder, single episode, unspecified: Secondary | ICD-10-CM

## 2020-02-10 DIAGNOSIS — Z0000 Encounter for general adult medical examination without abnormal findings: Secondary | ICD-10-CM

## 2020-02-10 LAB — COMPREHENSIVE METABOLIC PANEL
ALT: 10 U/L (ref 0–35)
AST: 16 U/L (ref 0–37)
Albumin: 3.8 g/dL (ref 3.5–5.2)
Alkaline Phosphatase: 41 U/L (ref 39–117)
BUN: 8 mg/dL (ref 6–23)
CO2: 31 mEq/L (ref 19–32)
Calcium: 9 mg/dL (ref 8.4–10.5)
Chloride: 103 mEq/L (ref 96–112)
Creatinine, Ser: 0.73 mg/dL (ref 0.40–1.20)
GFR: 99.24 mL/min (ref >60.00)
Glucose, Bld: 64 mg/dL — ABNORMAL LOW (ref 70–99)
Potassium: 3.8 mEq/L (ref 3.5–5.1)
Sodium: 139 mEq/L (ref 135–145)
Total Bilirubin: 0.3 mg/dL (ref 0.2–1.2)
Total Protein: 6.7 g/dL (ref 6.0–8.3)

## 2020-02-10 LAB — LIPID PANEL
Cholesterol: 179 mg/dL (ref 0–200)
HDL: 60.5 mg/dL (ref >39.00)
NonHDL: 118.96
Total CHOL/HDL Ratio: 3
Triglycerides: 208 mg/dL — ABNORMAL HIGH (ref 0.0–149.0)
VLDL: 41.6 mg/dL — ABNORMAL HIGH (ref 0.0–40.0)

## 2020-02-10 LAB — CBC
HCT: 41.4 % (ref 36.0–46.0)
Hemoglobin: 14 g/dL (ref 12.0–15.0)
MCHC: 33.8 g/dL (ref 30.0–36.0)
MCV: 88.1 fl (ref 78.0–100.0)
Platelets: 260 10*3/uL (ref 150.0–400.0)
RBC: 4.7 Mil/uL (ref 3.87–5.11)
RDW: 13.9 % (ref 11.5–15.5)
WBC: 6.8 10*3/uL (ref 4.0–10.5)

## 2020-02-10 LAB — LDL CHOLESTEROL, DIRECT: Direct LDL: 99 mg/dL

## 2020-02-10 MED ORDER — BUSPIRONE HCL 5 MG PO TABS: 5.0000 mg | ORAL_TABLET | Freq: Two times a day (BID) | ORAL | 2 refills | Status: DC

## 2020-02-10 NOTE — Patient Instructions (Signed)
Health Maintenance, Female Adopting a healthy lifestyle and getting preventive care are important in promoting health and wellness. Ask your health care provider about:  The right schedule for you to have regular tests and exams.  Things you can do on your own to prevent diseases and keep yourself healthy. What should I know about diet, weight, and exercise? Eat a healthy diet   Eat a diet that includes plenty of vegetables, fruits, low-fat dairy products, and lean protein.  Do not eat a lot of foods that are high in solid fats, added sugars, or sodium. Maintain a healthy weight Body mass index (BMI) is used to identify weight problems. It estimates body fat based on height and weight. Your health care provider can help determine your BMI and help you achieve or maintain a healthy weight. Get regular exercise Get regular exercise. This is one of the most important things you can do for your health. Most adults should:  Exercise for at least 150 minutes each week. The exercise should increase your heart rate and make you sweat (moderate-intensity exercise).  Do strengthening exercises at least twice a week. This is in addition to the moderate-intensity exercise.  Spend less time sitting. Even light physical activity can be beneficial. Watch cholesterol and blood lipids Have your blood tested for lipids and cholesterol at 23 years of age, then have this test every 5 years. Have your cholesterol levels checked more often if:  Your lipid or cholesterol levels are high.  You are older than 23 years of age.  You are at high risk for heart disease. What should I know about cancer screening? Depending on your health history and family history, you may need to have cancer screening at various ages. This may include screening for:  Breast cancer.  Cervical cancer.  Colorectal cancer.  Skin cancer.  Lung cancer. What should I know about heart disease, diabetes, and high blood  pressure? Blood pressure and heart disease  High blood pressure causes heart disease and increases the risk of stroke. This is more likely to develop in people who have high blood pressure readings, are of African descent, or are overweight.  Have your blood pressure checked: ? Every 3-5 years if you are 18-39 years of age. ? Every year if you are 40 years old or older. Diabetes Have regular diabetes screenings. This checks your fasting blood sugar level. Have the screening done:  Once every three years after age 40 if you are at a normal weight and have a low risk for diabetes.  More often and at a younger age if you are overweight or have a high risk for diabetes. What should I know about preventing infection? Hepatitis B If you have a higher risk for hepatitis B, you should be screened for this virus. Talk with your health care provider to find out if you are at risk for hepatitis B infection. Hepatitis C Testing is recommended for:  Everyone born from 1945 through 1965.  Anyone with known risk factors for hepatitis C. Sexually transmitted infections (STIs)  Get screened for STIs, including gonorrhea and chlamydia, if: ? You are sexually active and are younger than 24 years of age. ? You are older than 24 years of age and your health care provider tells you that you are at risk for this type of infection. ? Your sexual activity has changed since you were last screened, and you are at increased risk for chlamydia or gonorrhea. Ask your health care provider if   you are at risk.  Ask your health care provider about whether you are at high risk for HIV. Your health care provider may recommend a prescription medicine to help prevent HIV infection. If you choose to take medicine to prevent HIV, you should first get tested for HIV. You should then be tested every 3 months for as long as you are taking the medicine. Pregnancy  If you are about to stop having your period (premenopausal) and  you may become pregnant, seek counseling before you get pregnant.  Take 400 to 800 micrograms (mcg) of folic acid every day if you become pregnant.  Ask for birth control (contraception) if you want to prevent pregnancy. Osteoporosis and menopause Osteoporosis is a disease in which the bones lose minerals and strength with aging. This can result in bone fractures. If you are 65 years old or older, or if you are at risk for osteoporosis and fractures, ask your health care provider if you should:  Be screened for bone loss.  Take a calcium or vitamin D supplement to lower your risk of fractures.  Be given hormone replacement therapy (HRT) to treat symptoms of menopause. Follow these instructions at home: Lifestyle  Do not use any products that contain nicotine or tobacco, such as cigarettes, e-cigarettes, and chewing tobacco. If you need help quitting, ask your health care provider.  Do not use street drugs.  Do not share needles.  Ask your health care provider for help if you need support or information about quitting drugs. Alcohol use  Do not drink alcohol if: ? Your health care provider tells you not to drink. ? You are pregnant, may be pregnant, or are planning to become pregnant.  If you drink alcohol: ? Limit how much you use to 0-1 drink a day. ? Limit intake if you are breastfeeding.  Be aware of how much alcohol is in your drink. In the [place] one drink equals one 12 oz bottle of beer (355 mL), one 5 oz glass of wine (148 mL), or one 1 oz glass of hard liquor (44 mL). General instructions  Schedule regular health, dental, and eye exams.  Stay current with your vaccines.  Tell your health care provider if: ? You often feel depressed. ? You have ever been abused or do not feel safe at home. Summary  Adopting a healthy lifestyle and getting preventive care are important in promoting health and wellness.  Follow your health care provider's instructions about healthy  diet, exercising, and getting tested or screened for diseases.  Follow your health care provider's instructions on monitoring your cholesterol and blood pressure. This information is not intended to replace advice given to you by your health care provider. Make sure you discuss any questions you have with your health care provider. Document Revised: 09/30/2018 Document Reviewed: 09/30/2018 Elsevier Patient Education  2020 [employer]  

## 2020-02-10 NOTE — Addendum Note (Signed)
Addended by: Roena Malady on: 02/10/2020 04:53 PM   Modules accepted: Orders

## 2020-02-10 NOTE — Progress Notes (Signed)
Subjective:    Patient ID: Victoria Brooks, female    DOB: 1996-04-08, 24 y.o.   MRN: [id number]  HPI  Pt presents to the clinic today for her annual exam. She is also due to follow up chronic conditions.   Anxiety and Depression: Currently managed on Effexor. She feels like her depression is well controlled but not her anxiety. She has seen a therapist in the past and did not find it helpful. She denies SI/HI.  Flu: never Tetanus: 04/2009 Covid: 12/2019, 12/2019 Pap Smear: never Dentist: biannually  Diet: She does eat meat. She consumes fruits and veggies daily. She does eat some fried food. She drinks some water, mostly soda. Exercise: None  Review of Systems  Past Medical History:  Diagnosis Date  . Chicken pox     Current Outpatient Medications  Medication Sig Dispense Refill  . diazepam (VALIUM) 5 MG tablet Take 1 tablet (5 mg total) by mouth at bedtime. 5 tablet 0  . famotidine (PEPCID) 20 MG tablet Take 1 tablet (20 mg total) by mouth 2 (two) times daily. 60 tablet 0  . metoCLOPramide (REGLAN) 10 MG tablet Take 1 tablet (10 mg total) by mouth every 6 (six) hours as needed. 30 tablet 0  . ondansetron (ZOFRAN ODT) 4 MG disintegrating tablet Take 2 tablets (8 mg total) by mouth every 8 (eight) hours as needed for nausea or vomiting. 24 tablet 1  . Prenatal Vit-Fe Fumarate-FA (PRENATAL VITAMINS) 28-0.8 MG TABS Take 1 tablet by mouth daily. (Patient not taking: Reported on 05/10/2019) 30 tablet 1  . promethazine (PHENERGAN) 12.5 MG suppository Place 1 suppository (12.5 mg total) rectally every 6 (six) hours as needed for nausea or vomiting. 12 each 0  . venlafaxine XR (EFFEXOR-XR) 150 MG 24 hr capsule Take 1 capsule (150 mg total) by mouth daily with breakfast. 90 capsule 0   No current facility-administered medications for this visit.    No Known Allergies  Family History  Problem Relation Age of Onset  . Heart disease Father   . Hypertension Maternal Uncle   . Cancer  Maternal Grandmother        breast  . Alzheimer's disease Maternal Grandfather     Social History   Socioeconomic History  . Marital status: Single    Spouse name: Not on file  . Number of children: Not on file  . Years of education: Not on file  . Highest education level: Not on file  Occupational History  . Not on file  Tobacco Use  . Smoking status: Former Research scientist (life sciences)  . Smokeless tobacco: Never Used  Substance and Sexual Activity  . Alcohol use: Yes    Comment: occ  . Drug use: Yes    Types: Marijuana  . Sexual activity: Not on file  Other Topics Concern  . Not on file  Social History Narrative   Parents Divorced   Mom is custodial parent but, dad remains very involved   Brother 71 year older   Mom works for Physicist, medical   Social Determinants of Radio broadcast assistant Strain:   . Difficulty of Paying Living Expenses:   Food Insecurity:   . Worried About Charity fundraiser in the Last Year:   . Arboriculturist in the Last Year:   Transportation Needs:   . Film/video editor (Medical):   Marland Kitchen Lack of Transportation (Non-Medical):   Physical Activity:   . Days of Exercise per Week:   . Minutes  of Exercise per Session:   Stress:   . Feeling of Stress :   Social Connections:   . Frequency of Communication with Friends and Family:   . Frequency of Social Gatherings with Friends and Family:   . Attends Religious Services:   . Active Member of Clubs or Organizations:   . Attends Banker Meetings:   Marland Kitchen Marital Status:   Intimate Partner Violence:   . Fear of Current or Ex-Partner:   . Emotionally Abused:   Marland Kitchen Physically Abused:   . Sexually Abused:      Constitutional: Denies fever, malaise, fatigue, headache or abrupt weight changes.  HEENT: Denies eye pain, eye redness, ear pain, ringing in the ears, wax buildup, runny nose, nasal congestion, bloody nose, or sore throat. Respiratory: Denies difficulty breathing, shortness of  breath, cough or sputum production.   Cardiovascular: Denies chest pain, chest tightness, palpitations or swelling in the hands or feet.  Gastrointestinal: Denies abdominal pain, bloating, constipation, diarrhea or blood in the stool.  GU: Denies urgency, frequency, pain with urination, burning sensation, blood in urine, odor or discharge. Musculoskeletal: Denies decrease in range of motion, difficulty with gait, muscle pain or joint pain and swelling.  Skin: Denies redness, rashes, lesions or ulcercations.  Neurological: Denies dizziness, difficulty with memory, difficulty with speech or problems with balance and coordination.  Psych: Pt has a history of anxiety and depression. Denies SI/HI.  No other specific complaints in a complete review of systems (except as listed in HPI above).     Objective:   Physical Exam  BP 108/74   Pulse 83   Temp 97.8 F (36.6 C) (Temporal)   Ht 5' 5.75" (1.67 m)   Wt 134 lb (60.8 kg)   LMP 01/23/2020 (Exact Date)   SpO2 98%   Breastfeeding Unknown   BMI 21.79 kg/m   Wt Readings from Last 3 Encounters:  09/13/19 130 lb (59 kg)  05/10/19 118 lb (53.5 kg)  05/08/19 121 lb (54.9 kg)    General: Appears her stated age, well developed, well nourished in NAD. Skin: Warm, dry and intact. No rashes noted. HEENT: Head: normal shape and size; Eyes: sclera white, no icterus, conjunctiva pink, PERRLA and EOMs intact;  Neck:  Neck supple, trachea midline. No masses, lumps or thyromegaly present.  Cardiovascular: Normal rate and rhythm. S1,S2 noted.  No murmur, rubs or gallops noted. No JVD or BLE edema. Pulmonary/Chest: Normal effort and positive vesicular breath sounds. No respiratory distress. No wheezes, rales or ronchi noted.  Abdomen: Soft and nontender. Normal bowel sounds. No distention or masses noted. Liver, spleen and kidneys non palpable. Pelvic: Normal female anatomy. Cervix without changes. No discharge or odor present. No CMT. Adnexa non  palpable. Musculoskeletal: Strength 5/5 BUE/BLE. No difficulty with gait.  Neurological: Alert and oriented. Cranial nerves II-XII grossly intact. Coordination normal.  Psychiatric: Mood and affect normal. Behavior is normal. Judgment and thought content normal.    BMET    Component Value Date/Time   NA 134 (L) 09/13/2019 1331   K 3.6 09/13/2019 1331   CL 95 (L) 09/13/2019 1331   CO2 27 09/13/2019 1331   GLUCOSE 126 (H) 09/13/2019 1331   BUN 15 09/13/2019 1331   CREATININE 0.83 09/13/2019 1331   CALCIUM 9.2 09/13/2019 1331   GFRNONAA >60 09/13/2019 1331   GFRAA >60 09/13/2019 1331    Lipid Panel     Component Value Date/Time   CHOL 185 12/08/2018 1527   TRIG 54.0 12/08/2018  1527   HDL 69.20 12/08/2018 1527   CHOLHDL 3 12/08/2018 1527   VLDL 10.8 12/08/2018 1527   LDLCALC 105 (H) 12/08/2018 1527    CBC    Component Value Date/Time   WBC 10.3 09/13/2019 1331   RBC 5.45 (H) 09/13/2019 1331   HGB 16.0 (H) 09/13/2019 1331   HCT 44.7 09/13/2019 1331   PLT 319 09/13/2019 1331   MCV 82.0 09/13/2019 1331   MCH 29.4 09/13/2019 1331   MCHC 35.8 09/13/2019 1331   RDW 13.1 09/13/2019 1331    Hgb A1C No results found for: HGBA1C          Assessment & Plan:   Preventative Health Maintenance:  Encouraged her to get a flu shot in the fall Due for tetanus but will have to get at next annual as she just got a covid vaccine Covid vaccine UTD Pap smear today- she declines STD screening Encouraged her to consume a balanced diet and exercise regimen Advised her to see an eye doctor and dentist annually Will check CBC, CMET, Lipid profile today. She declines STD screening today  RTC in 1 year, sooner if needed   Nicki Reaper, NP This visit occurred during the SARS-CoV-2 public health emergency.  Safety protocols were in place, including screening questions prior to the visit, additional usage of staff PPE, and extensive cleaning of exam room while observing appropriate  contact time as indicated for disinfecting solutions.

## 2020-02-10 NOTE — Assessment & Plan Note (Signed)
Deteriorated Continue Effexor Will add Buspar 5 mg BID She declines referral for therapy Support offered today

## 2020-02-16 LAB — CYTOLOGY - PAP
Comment: NEGATIVE
Diagnosis: UNDETERMINED — AB
High risk HPV: NEGATIVE

## 2020-03-03 ENCOUNTER — Other Ambulatory Visit: Payer: Self-pay | Admitting: Internal Medicine

## 2020-03-03 NOTE — Telephone Encounter (Signed)
Pharmacy requesting for 90 day supply. Ok to fill?

## 2020-04-04 ENCOUNTER — Ambulatory Visit
Admission: EM | Admit: 2020-04-04 | Discharge: 2020-04-04 | Disposition: A | Discharge status: Home / Self Care | Payer: 59 | Attending: Nurse Practitioner | Admitting: Nurse Practitioner

## 2020-04-04 ENCOUNTER — Other Ambulatory Visit: Payer: Self-pay

## 2020-04-04 DIAGNOSIS — R112 Nausea with vomiting, unspecified: Secondary | ICD-10-CM | POA: Diagnosis present

## 2020-04-04 LAB — URINALYSIS, COMPLETE (UACMP) WITH MICROSCOPIC
Glucose, UA: NEGATIVE mg/dL
Hgb urine dipstick: NEGATIVE
Ketones, ur: 15 mg/dL — AB
Leukocytes,Ua: NEGATIVE
Nitrite: NEGATIVE
Protein, ur: 100 mg/dL — AB
RBC / HPF: NONE SEEN RBC/hpf (ref 0–5)
Specific Gravity, Urine: >1.03 — ABNORMAL HIGH (ref 1.005–1.030)
pH: 5.5 (ref 5.0–8.0)

## 2020-04-04 LAB — PREGNANCY, URINE: Preg Test, Ur: NEGATIVE

## 2020-04-04 MED ORDER — ONDANSETRON 8 MG PO TBDP
8.0000 mg | ORAL_TABLET | Freq: Once | ORAL | Status: AC
  Administered 2020-04-04: 8 mg via ORAL

## 2020-04-04 MED ORDER — SODIUM CHLORIDE 0.9 % IV SOLN: Freq: Once | INTRAVENOUS | Status: AC

## 2020-04-04 MED ORDER — ONDANSETRON 8 MG PO TBDP: 8.0000 mg | ORAL_TABLET | Freq: Three times a day (TID) | ORAL | 0 refills | Status: DC | PRN

## 2020-04-04 MED ORDER — ONDANSETRON 8 MG PO TBDP: 8.0000 mg | ORAL_TABLET | Freq: Once | ORAL | Status: DC

## 2020-04-04 MED ORDER — METOCLOPRAMIDE HCL 10 MG PO TABS: 10.0000 mg | ORAL_TABLET | Freq: Three times a day (TID) | ORAL | 0 refills | Status: DC

## 2020-04-04 NOTE — ED Triage Notes (Addendum)
Patient complains of nausea and vomiting, abdominal pain since Sunday. States that she is really thirsty but isn't able to drink.

## 2020-04-04 NOTE — Discharge Instructions (Addendum)
Drink clear fluids in small amounts as you are able, such as: Water. Ice chips. Fruit juice that has water added (diluted fruit juice). Low-calorie sports drinks. Eat bland, easy-to-digest foods in small amounts as you are able, such as: Bananas. Applesauce. Rice. Low-fat (lean) meats. Toast. Crackers. Avoid drinking fluids that have a lot of sugar or caffeine in them. This includes energy drinks, sports drinks, and soda. Avoid alcohol. Avoid spicy or fatty foods. Avoid dairy, fried and greasy foods for the next several days   Go to the ED immediately if: You have pain in your chest, neck, arm, or jaw. You feel very weak or you pass out (faint). You throw up again and again. You have throw up that is bright red or looks like black coffee grounds. You have bloody or black poop (stools) or poop that looks like tar. You have a very bad headache, a stiff neck, or both. You have very bad pain, cramping, or bloating in your belly (abdomen). You have trouble breathing. You are breathing very quickly. Your heart is beating very quickly. Your skin feels cold and clammy. You feel confused. You have signs of losing too much water in your body, such as: Dark pee, very little pee, or no pee. Cracked lips. Dry mouth. Sunken eyes. Sleepiness. Weakness.

## 2020-04-04 NOTE — ED Provider Notes (Signed)
[facility]    CSN: [id number] Arrival date & time: 04/04/20  1136      History   Chief Complaint Chief Complaint  Patient presents with  . Emesis    HPI Victoria Brooks is a 24 y.o. female.   Subjective:   Victoria Brooks is a 24 y.o. female who presents for evaluation of nausea and vomiting. Onset of symptoms was 2 days ago. Patient describes nausea as moderate in severity. Vomiting has occurred several times over the past 2 days. Vomitus is described as undigested food and dry heaves. Symptoms have been associated with the ability to keep down some fluids.  Patient denies alcohol overuse, fever, hematemesis, melena, possibility of pregnancy or abdominal pain. She has been completely vaccinated against COVID-19 more than 30 days ago. Symptoms have waxed and waned as onset.  LMP was last week.  Patient is sexually active.  She is on oral contraceptives and reports compliance with this therapy. Evaluation to date has been none. Treatment to date has been none.         Past Medical History:  Diagnosis Date  . Chicken pox     Patient Active Problem List   Diagnosis Date Noted  . Anxiety and depression 07/23/2016    Past Surgical History:  Procedure Laterality Date  . ADENOIDECTOMY    . SHOULDER SURGERY Right   . TONSILLECTOMY    . WISDOM TOOTH EXTRACTION      OB History    Gravida  1   Para      Term      Preterm      AB      Living        SAB      TAB      Ectopic      Multiple      Live Births               Home Medications    Prior to Admission medications   Medication Sig Start Date End Date Taking? Authorizing Provider  busPIRone (BUSPAR) 5 MG tablet TAKE 1 TABLET BY MOUTH TWICE A DAY 03/03/20  Yes Lorre Munroe, NP  venlafaxine XR (EFFEXOR-XR) 150 MG 24 hr capsule Take 1 capsule (150 mg total) by mouth daily with breakfast. 01/05/20  Yes Baity, Salvadore Oxford, NP  metoCLOPramide (REGLAN) 10 MG tablet Take 1 tablet (10 mg total) by  mouth 3 (three) times daily before meals for 5 days. 04/04/20 04/09/20  Lurline Idol, FNP  ondansetron (ZOFRAN ODT) 8 MG disintegrating tablet Take 1 tablet (8 mg total) by mouth every 8 (eight) hours as needed for nausea or vomiting. 04/04/20   Lurline Idol, FNP    Family History Family History  Problem Relation Age of Onset  . Heart disease Father   . Hypertension Maternal Uncle   . Cancer Maternal Grandmother        breast  . Alzheimer's disease Maternal Grandfather     Social History Social History   Tobacco Use  . Smoking status: Former Games developer  . Smokeless tobacco: Never Used  Vaping Use  . Vaping Use: Every day  Substance Use Topics  . Alcohol use: Yes    Comment: occ  . Drug use: Yes    Types: Marijuana     Allergies   Patient has no known allergies.   Review of Systems Review of Systems  Constitutional: Positive for appetite change and fatigue. Negative for fever.  HENT: Negative.   Respiratory:  Negative.   Cardiovascular: Negative.   Gastrointestinal: Positive for nausea and vomiting. Negative for abdominal pain and diarrhea.  Genitourinary: Negative.   Skin: Negative.   Neurological: Positive for weakness.  All other systems reviewed and are negative.    Physical Exam Triage Vital Signs ED Triage Vitals  Enc Vitals Group     BP 04/04/20 1229 (!) 151/114     Pulse Rate 04/04/20 1229 78     Resp 04/04/20 1229 18     Temp 04/04/20 1229 97.7 F (36.5 C)     Temp Source 04/04/20 1229 Oral     SpO2 04/04/20 1229 100 %     Weight 04/04/20 1227 140 lb (63.5 kg)     Height --      Head Circumference --      Peak Flow --      Pain Score 04/04/20 1227 5     Pain Loc --      Pain Edu? --      Excl. in GC? --    No data found.  Updated Vital Signs BP (!) 151/114 (BP Location: Left Arm)   Pulse 78   Temp 97.7 F (36.5 C) (Oral)   Resp 18   Wt 140 lb (63.5 kg)   LMP 03/28/2020   SpO2 100%   BMI 22.77 kg/m   Visual Acuity Right Eye  Distance:   Left Eye Distance:   Bilateral Distance:    Right Eye Near:   Left Eye Near:    Bilateral Near:     Physical Exam Vitals reviewed.  Constitutional:      General: She is not in acute distress.    Appearance: Normal appearance. She is ill-appearing. She is not toxic-appearing or diaphoretic.  Cardiovascular:     Rate and Rhythm: Normal rate and regular rhythm.     Pulses: Normal pulses.     Heart sounds: Normal heart sounds.  Pulmonary:     Effort: Pulmonary effort is normal.     Breath sounds: Normal breath sounds.  Abdominal:     General: Bowel sounds are normal. There is no distension.     Palpations: Abdomen is soft.     Tenderness: There is no abdominal tenderness. There is no right CVA tenderness or left CVA tenderness.  Musculoskeletal:        General: Normal range of motion.     Cervical back: Normal range of motion and neck supple.     Right lower leg: No edema.  Skin:    General: Skin is warm.  Neurological:     General: No focal deficit present.     Mental Status: She is alert and oriented to person, place, and time.  Psychiatric:        Mood and Affect: Mood normal.        Behavior: Behavior normal.      UC Treatments / Results  Labs (all labs ordered are listed, but only abnormal results are displayed) Labs Reviewed  URINALYSIS, COMPLETE (UACMP) WITH MICROSCOPIC - Abnormal; Notable for the following components:      Result Value   Color, Urine AMBER (*)    APPearance CLOUDY (*)    Specific Gravity, Urine >1.030 (*)    Bilirubin Urine MODERATE (*)    Ketones, ur 15 (*)    Protein, ur 100 (*)    Bacteria, UA FEW (*)    All other components within normal limits  PREGNANCY, URINE    EKG   Radiology  No results found.  Procedures Procedures (including critical care time)  Medications Ordered in UC Medications  ondansetron (ZOFRAN-ODT) disintegrating tablet 8 mg (8 mg Oral Given 04/04/20 1239)  0.9 %  sodium chloride infusion (  Intravenous New Bag/Given 04/04/20 1303)    Initial Impression / Assessment and Plan / UC Course  I have reviewed the triage vital signs and the nursing notes.  Pertinent labs & imaging results that were available during my care of the patient were reviewed by me and considered in my medical decision making (see chart for details).    24 yo female with no significant medical history presents with a two-day history of acute nausea and vomiting. No fevers, body aches, alcohol overuse, fever, hematemesis, melena or possibility of pregnancy. She has been completely vaccinated against COVID-19 more than 30 days ago. Urine pregnancy negative. Urinalysis negative for acute infection. Ketones present which indicates acute dehydration. Patient given Zofran and a liter of IV fluids in the clinic. She has had no further vomiting and has been able to tolerate PO without difficulty. She reports feeling better now than she did when she came to the urgent care.  Plan:  Antiemetics per medication orders. Dietary guidelines discussed. Agricultural engineer distributed. PRN antiemetic per medication orders. Discussed indications for immediate ED follow-up  Today's evaluation has revealed no signs of a dangerous process. Discussed diagnosis with patient and/or guardian. Patient and/or guardian aware of their diagnosis, possible red flag symptoms to watch out for and need for close follow up. Patient and/or guardian understands verbal and written discharge instructions. Patient and/or guardian comfortable with plan and disposition.  Patient and/or guardian has a clear mental status at this time, good insight into illness (after discussion and teaching) and has clear judgment to make decisions regarding their care  This care was provided during an unprecedented National Emergency due to the Novel Coronavirus (COVID-19) pandemic. COVID-19 infections and transmission risks place heavy strains on healthcare resources.   As this pandemic evolves, our facility, providers, and staff strive to respond fluidly, to remain operational, and to provide care relative to available resources and information. Outcomes are unpredictable and treatments are without well-defined guidelines. Further, the impact of COVID-19 on all aspects of urgent care, including the impact to patients seeking care for reasons other than COVID-19, is unavoidable during this national emergency. At this time of the global pandemic, management of patients has significantly changed, even for non-COVID positive patients given high local and regional COVID volumes at this time requiring high healthcare system and resource utilization. The standard of care for management of both COVID suspected and non-COVID suspected patients continues to change rapidly at the local, regional, national, and global levels. This patient was worked up and treated to the best available but ever changing evidence and resources available at this current time.   Documentation was completed with the aid of voice recognition software. Transcription may contain typographical errors. Final Clinical Impressions(s) / UC Diagnoses   Final diagnoses:  Intractable nausea and vomiting     Discharge Instructions     1. Drink clear fluids in small amounts as you are able, such as: ? Water. ? Ice chips. ? Fruit juice that has water added (diluted fruit juice). ? Low-calorie sports drinks. 2. Eat bland, easy-to-digest foods in small amounts as you are able, such as: ? Bananas. ? Applesauce. ? Rice. ? Low-fat (lean) meats. ? Toast. ? Crackers. 3. Avoid drinking fluids that have a lot of sugar or caffeine in  them. This includes energy drinks, sports drinks, and soda. 4. Avoid alcohol. 5. Avoid spicy or fatty foods. 6. Avoid dairy, fried and greasy foods for the next several days   Go to the ED immediately if: 1. You have pain in your chest, neck, arm, or jaw. 2. You feel very weak  or you pass out (faint). 3. You throw up again and again. 4. You have throw up that is bright red or looks like black coffee grounds. 5. You have bloody or black poop (stools) or poop that looks like tar. 6. You have a very bad headache, a stiff neck, or both. 7. You have very bad pain, cramping, or bloating in your belly (abdomen). 8. You have trouble breathing. 9. You are breathing very quickly. 10. Your heart is beating very quickly. 11. Your skin feels cold and clammy. 12. You feel confused. 13. You have signs of losing too much water in your body, such as: ? Dark pee, very little pee, or no pee. ? Cracked lips. ? Dry mouth. ? Sunken eyes. ? Sleepiness. ? Weakness.    ED Prescriptions    Medication Sig Dispense Auth. Provider   ondansetron (ZOFRAN ODT) 8 MG disintegrating tablet Take 1 tablet (8 mg total) by mouth every 8 (eight) hours as needed for nausea or vomiting. 20 tablet Enrique Sack, FNP   metoCLOPramide (REGLAN) 10 MG tablet Take 1 tablet (10 mg total) by mouth 3 (three) times daily before meals for 5 days. 15 tablet Enrique Sack, FNP     PDMP not reviewed this encounter.   Enrique Sack, [place] 04/04/20 1458

## 2020-04-10 ENCOUNTER — Other Ambulatory Visit: Payer: Self-pay

## 2020-04-10 ENCOUNTER — Encounter: Payer: Self-pay | Admitting: Internal Medicine

## 2020-04-10 ENCOUNTER — Ambulatory Visit (INDEPENDENT_AMBULATORY_CARE_PROVIDER_SITE_OTHER): Payer: 59 | Admitting: Internal Medicine

## 2020-04-10 VITALS — BP 124/78 | HR 104 | Temp 97.6°F | Wt 129.0 lb

## 2020-04-10 DIAGNOSIS — R112 Nausea with vomiting, unspecified: Secondary | ICD-10-CM | POA: Diagnosis not present

## 2020-04-10 MED ORDER — HYDROXYZINE HCL 10 MG PO TABS: 10.0000 mg | ORAL_TABLET | Freq: Three times a day (TID) | ORAL | 0 refills | Status: DC | PRN

## 2020-04-10 NOTE — Progress Notes (Signed)
Subjective:    Patient ID: Victoria Brooks, female    DOB: 07/29/1996, 24 y.o.   MRN: [id number]  HPI  Pt presents to the clinic today for UC follow up. She went to [facility] 6/15 with c/o nausea and vomiting. Urinalysis was negative for infection. She was treated with Zofran and IV fluids. She was discharged with RX for Reglan and Zofran. Since discharge, she never really took the Metoclopramide but has been taking the Zofran as needed. She reports her symptoms have resolved. Her mother is concerned this could be anxiety related but she does not feel like it is. She denies recent changes in diet or medications.  Review of Systems      Past Medical History:  Diagnosis Date  . Chicken pox     Current Outpatient Medications  Medication Sig Dispense Refill  . busPIRone (BUSPAR) 5 MG tablet TAKE 1 TABLET BY MOUTH TWICE A DAY 180 tablet 1  . metoCLOPramide (REGLAN) 10 MG tablet Take 1 tablet (10 mg total) by mouth 3 (three) times daily before meals for 5 days. 15 tablet 0  . ondansetron (ZOFRAN ODT) 8 MG disintegrating tablet Take 1 tablet (8 mg total) by mouth every 8 (eight) hours as needed for nausea or vomiting. 20 tablet 0  . venlafaxine XR (EFFEXOR-XR) 150 MG 24 hr capsule Take 1 capsule (150 mg total) by mouth daily with breakfast. 90 capsule 0   No current facility-administered medications for this visit.    No Known Allergies  Family History  Problem Relation Age of Onset  . Heart disease Father   . Hypertension Maternal Uncle   . Cancer Maternal Grandmother        breast  . Alzheimer's disease Maternal Grandfather     Social History   Socioeconomic History  . Marital status: Single    Spouse name: Not on file  . Number of children: Not on file  . Years of education: Not on file  . Highest education level: Not on file  Occupational History  . Not on file  Tobacco Use  . Smoking status: Former Games developer  . Smokeless tobacco: Never Used  Vaping Use  . Vaping Use: Every day   Substance and Sexual Activity  . Alcohol use: Yes    Comment: occ  . Drug use: Yes    Types: Marijuana  . Sexual activity: Not on file  Other Topics Concern  . Not on file  Social History Narrative   Parents Divorced   Mom is custodial parent but, dad remains very involved   Brother 1 year older   Mom works for Orth[employer]   Social Determinants of Corporate investment banker Strain:   . Difficulty of Paying Living Expenses:   Food Insecurity:   . Worried About Programme researcher, broadcasting/film/video in the Last Year:   . Barista in the Last Year:   Transportation Needs:   . Freight forwarder (Medical):   Marland Kitchen Lack of Transportation (Non-Medical):   Physical Activity:   . Days of Exercise per Week:   . Minutes of Exercise per Session:   Stress:   . Feeling of Stress :   Social Connections:   . Frequency of Communication with Friends and Family:   . Frequency of Social Gatherings with Friends and Family:   . Attends Religious Services:   . Active Member of Clubs or Organizations:   . Attends Banker Meetings:   Marland Kitchen Marital Status:  Intimate Partner Violence:   . Fear of Current or Ex-Partner:   . Emotionally Abused:   Marland Kitchen Physically Abused:   . Sexually Abused:      Constitutional: Denies fever, malaise, fatigue, headache or abrupt weight changes.  Respiratory: Denies difficulty breathing, shortness of breath, cough or sputum production.   Cardiovascular: Denies chest pain, chest tightness, palpitations or swelling in the hands or feet.  Gastrointestinal: Pt reports intermittent nausea. Denies abdominal pain, bloating, constipation, diarrhea or blood in the stool.  GU: Denies urgency, frequency, pain with urination, burning sensation, blood in urine, odor or discharge. Psych: Pt has a history of anxiety. Denies depression, SI/HI.  No other specific complaints in a complete review of systems (except as listed in HPI above).  Objective:    Physical Exam BP 124/78   Pulse (!) 104   Temp 97.6 F (36.4 C) (Temporal)   Wt 129 lb (58.5 kg)   LMP 03/28/2020   SpO2 99%   BMI 20.98 kg/m   Wt Readings from Last 3 Encounters:  04/04/20 140 lb (63.5 kg)  02/10/20 134 lb (60.8 kg)  09/13/19 130 lb (59 kg)    General: Appears her stated age, well developed, well nourished in NAD. Neck:  Neck supple, trachea midline. No masses, lumps or thyromegaly present.  Cardiovascular: Tachycardic with normal rhythm. S1,S2 noted.  No murmur, rubs or gallops noted. No JVD or BLE edema. No carotid bruits noted. Pulmonary/Chest: Normal effort and positive vesicular breath sounds. No respiratory distress. No wheezes, rales or ronchi noted.  Neurological: Alert and oriented.  Psychiatric: Mood and affect normal. Behavior is normal. Judgment and thought content normal.     BMET    Component Value Date/Time   NA 139 02/10/2020 1124   K 3.8 02/10/2020 1124   CL 103 02/10/2020 1124   CO2 31 02/10/2020 1124   GLUCOSE 64 (L) 02/10/2020 1124   BUN 8 02/10/2020 1124   CREATININE 0.73 02/10/2020 1124   CALCIUM 9.0 02/10/2020 1124   GFRNONAA >60 09/13/2019 1331   GFRAA >60 09/13/2019 1331    Lipid Panel     Component Value Date/Time   CHOL 179 02/10/2020 1124   TRIG 208.0 (H) 02/10/2020 1124   HDL 60.50 02/10/2020 1124   CHOLHDL 3 02/10/2020 1124   VLDL 41.6 (H) 02/10/2020 1124   LDLCALC 105 (H) 12/08/2018 1527    CBC    Component Value Date/Time   WBC 6.8 02/10/2020 1124   RBC 4.70 02/10/2020 1124   HGB 14.0 02/10/2020 1124   HCT 41.4 02/10/2020 1124   PLT 260.0 02/10/2020 1124   MCV 88.1 02/10/2020 1124   MCH 29.4 09/13/2019 1331   MCHC 33.8 02/10/2020 1124   RDW 13.9 02/10/2020 1124    Hgb A1C No results found for: HGBA1C         Assessment & Plan:   UC Follow Up for Nausea and Vomiting:  UC notes and labs reviewed D/C Reglan Continue Zofran as needed RX for Hydroxyzine to take as needed for anxiety Increase  Buspar to 5 mgTID Continue Wellbutrin Set up an appt with the therapist you were previously referred tod   Return precautions discussed Webb Silversmith, NP This visit occurred during the SARS-CoV-2 public health emergency.  Safety protocols were in place, including screening questions prior to the visit, additional usage of staff PPE, and extensive cleaning of exam room while observing appropriate contact time as indicated for disinfecting solutions.

## 2020-04-10 NOTE — Patient Instructions (Signed)
Nausea, Adult Nausea is feeling sick to your stomach or feeling that you are about to throw up (vomit). Feeling sick to your stomach is usually not serious, but it may be an early sign of a more serious medical problem. As you feel sicker to your stomach, you may throw up. If you throw up, or if you are not able to drink enough fluids, there is a risk that you may lose too much water in your body (get dehydrated). If you lose too much water in your body, you may:  Feel tired.  Feel thirsty.  Have a dry mouth.  Have cracked lips.  Go pee (urinate) less often. Older adults and people who have other diseases or a weak body defense system (immune system) have a higher risk of losing too much water in the body. The main goals of treating this condition are:  To relieve your nausea.  To ensure your nausea occurs less often.  To prevent throwing up and losing too much fluid. Follow these instructions at home: Watch your symptoms for any changes. Tell your doctor about them. Follow these instructions as told by your doctor. Eating and drinking      Take an ORS (oral rehydration solution). This is a drink that is sold at pharmacies and stores.  Drink clear fluids in small amounts as you are able. These include: ? Water. ? Ice chips. ? Fruit juice that has water added (diluted fruit juice). ? Low-calorie sports drinks.  Eat bland, easy-to-digest foods in small amounts as you are able, such as: ? Bananas. ? Applesauce. ? Rice. ? Low-fat (lean) meats. ? Toast. ? Crackers.  Avoid drinking fluids that have a lot of sugar or caffeine in them. This includes energy drinks, sports drinks, and soda.  Avoid alcohol.  Avoid spicy or fatty foods. General instructions  Take over-the-counter and prescription medicines only as told by your doctor.  Rest at home while you get better.  Drink enough fluid to keep your pee (urine) pale yellow.  Take slow and deep breaths when you feel  sick to your stomach.  Avoid food or things that have strong smells.  Wash your hands often with soap and water. If you cannot use soap and water, use hand sanitizer.  Make sure that all people in your home wash their hands well and often.  Keep all follow-up visits as told by your doctor. This is important. Contact a doctor if:  You feel sicker to your stomach.  You feel sick to your stomach for more than 2 days.  You throw up.  You are not able to drink fluids without throwing up.  You have new symptoms.  You have a fever.  You have a headache.  You have muscle cramps.  You have a rash.  You have pain while peeing.  You feel light-headed or dizzy. Get help right away if:  You have pain in your chest, neck, arm, or jaw.  You feel very weak or you pass out (faint).  You have throw up that is bright red or looks like coffee grounds.  You have bloody or black poop (stools) or poop that looks like tar.  You have a very bad headache, a stiff neck, or both.  You have very bad pain, cramping, or bloating in your belly (abdomen).  You have trouble breathing or you are breathing very quickly.  Your heart is beating very quickly.  Your skin feels cold and clammy.  You feel confused.    You have signs of losing too much water in your body, such as: ? Dark pee, very little pee, or no pee. ? Cracked lips. ? Dry mouth. ? Sunken eyes. ? Sleepiness. ? Weakness. These symptoms may be an emergency. Do not wait to see if the symptoms will go away. Get medical help right away. Call your local emergency services (911 in the [place] Do not drive yourself to the hospital. Summary  Nausea is feeling sick to your stomach or feeling that you are about to throw up (vomit).  If you throw up, or if you are not able to drink enough fluids, there is a risk that you may lose too much water in your body (get dehydrated).  Eat and drink what your doctor tells you. Take  over-the-counter and prescription medicines only as told by your doctor.  Contact a doctor right away if your symptoms get worse or you have new symptoms.  Keep all follow-up visits as told by your doctor. This is important. This information is not intended to replace advice given to you by your health care provider. Make sure you discuss any questions you have with your health care provider. Document Revised: 03/17/2018 Document Reviewed: 03/17/2018 Elsevier Patient Education  2020 Elsevier Inc.  

## 2020-07-08 ENCOUNTER — Other Ambulatory Visit: Payer: Self-pay | Admitting: Internal Medicine

## 2021-02-05 IMAGING — US OBSTETRIC <14 WK US AND TRANSVAGINAL OB US
1 series · 14 of 28 positions shown · non-contrast
Comparison: None.

CLINICAL DATA: Vomiting, abdominal pain

EXAM:
OBSTETRIC <14 WK US AND TRANSVAGINAL OB US
TECHNIQUE: Both transabdominal and transvaginal ultrasound examinations were
performed for complete evaluation of the gestation as well as the
maternal uterus, adnexal regions, and pelvic cul-de-sac.
Transvaginal technique was performed to assess early pregnancy.

[Series 1: obstetric <14 wk us and transvaginal ob us · 77 acquisitions, 14 frames shown]
[im 3/77]
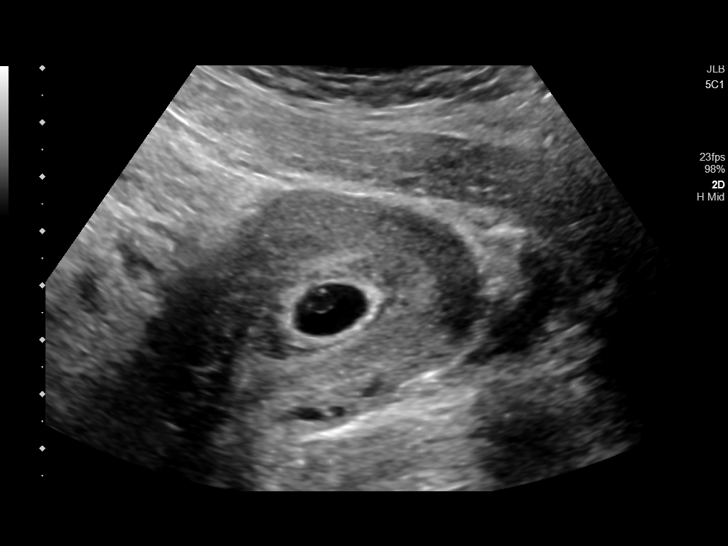
[im 9/77]
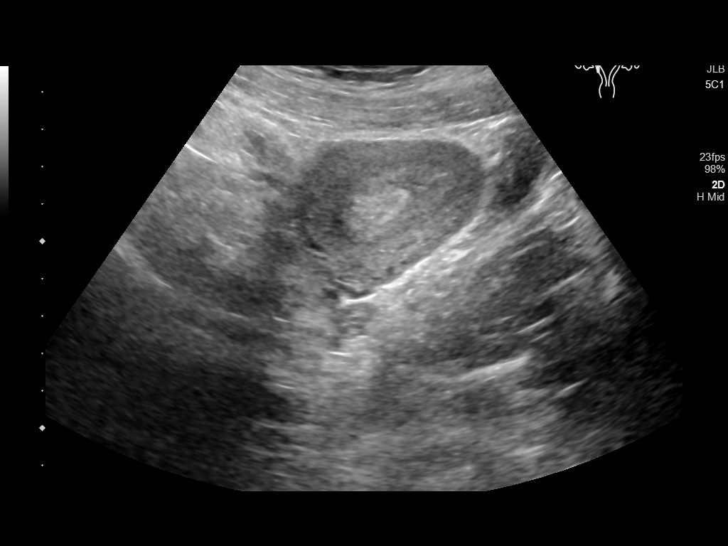
[im 15/77]
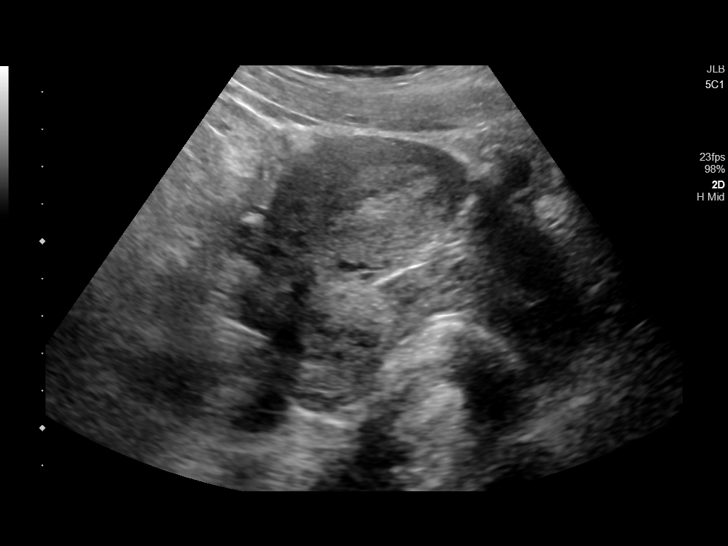
[im 20/77]
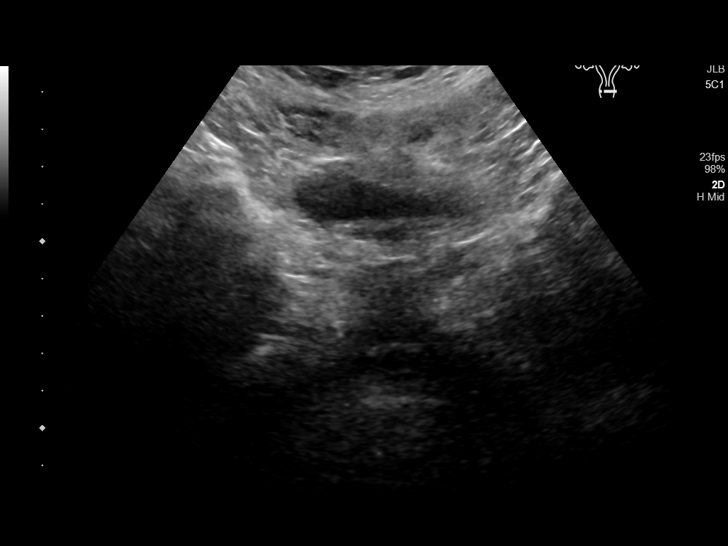
[im 26/77]
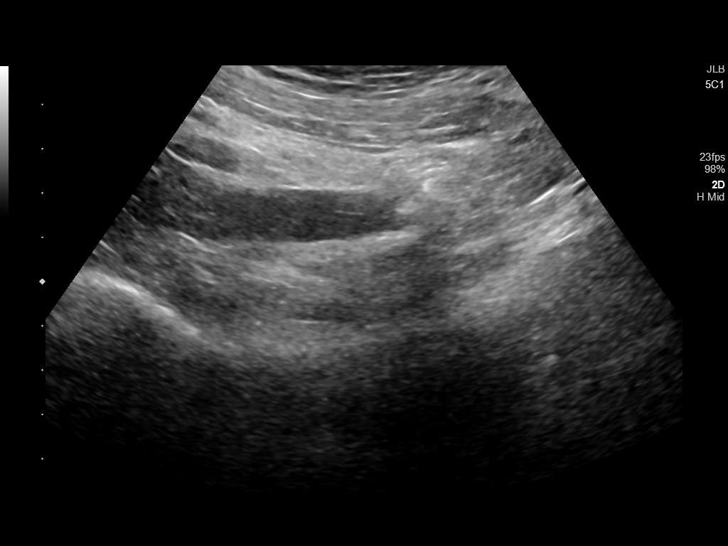
[im 31/77]
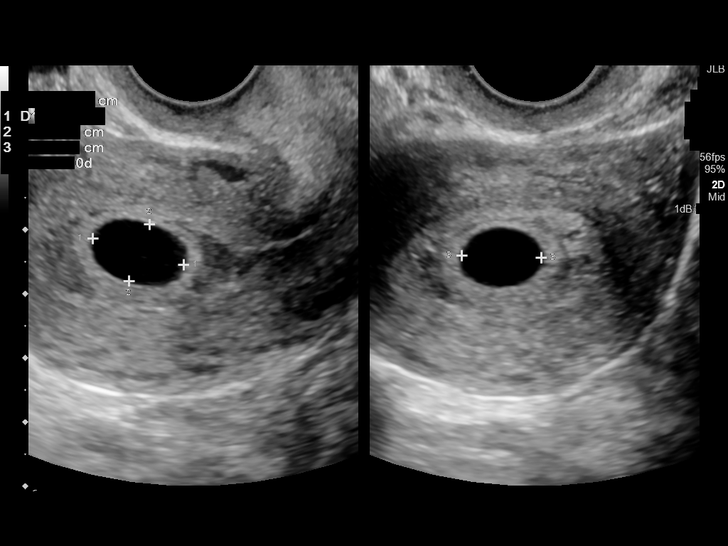
[im 37/77]
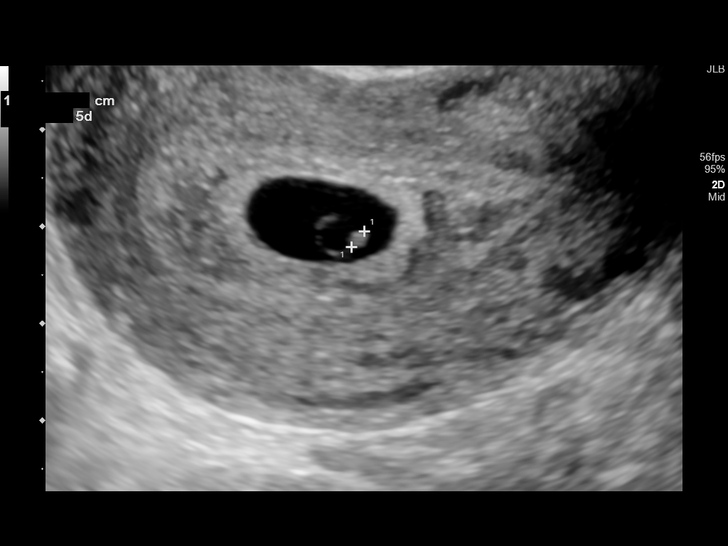
[im 43/77]
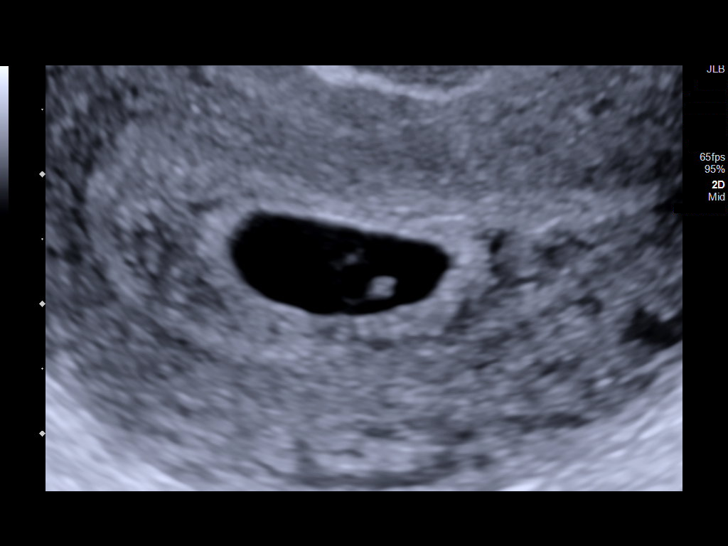
[im 48/77]
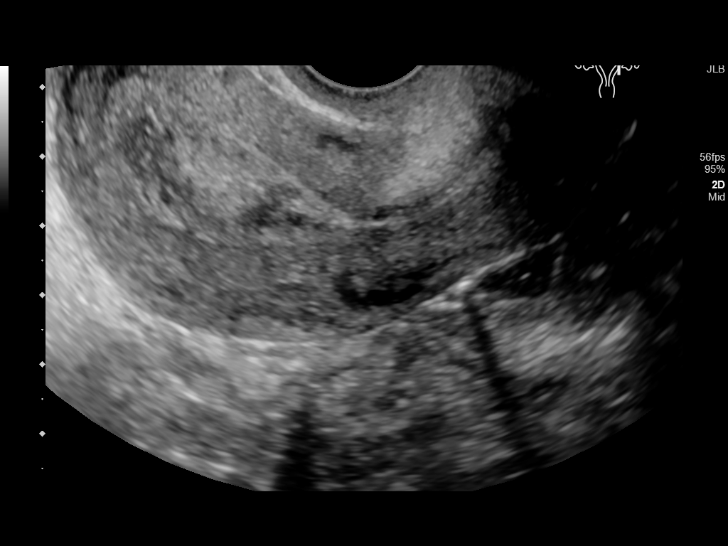
[im 54/77]
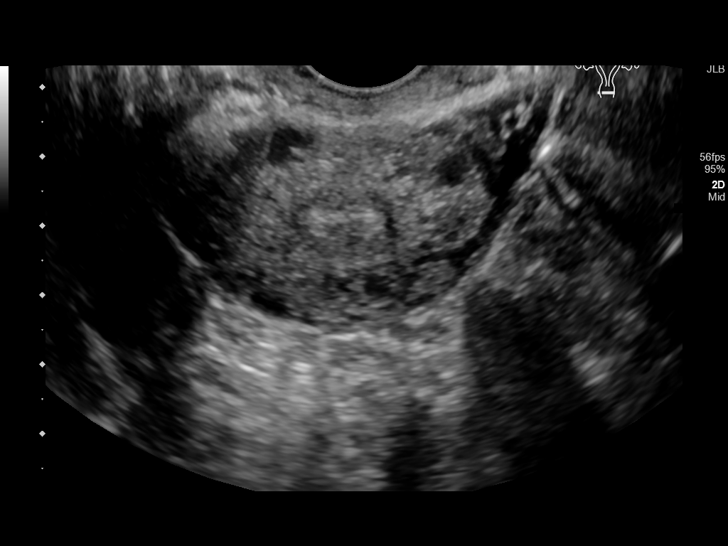
[im 60/77]
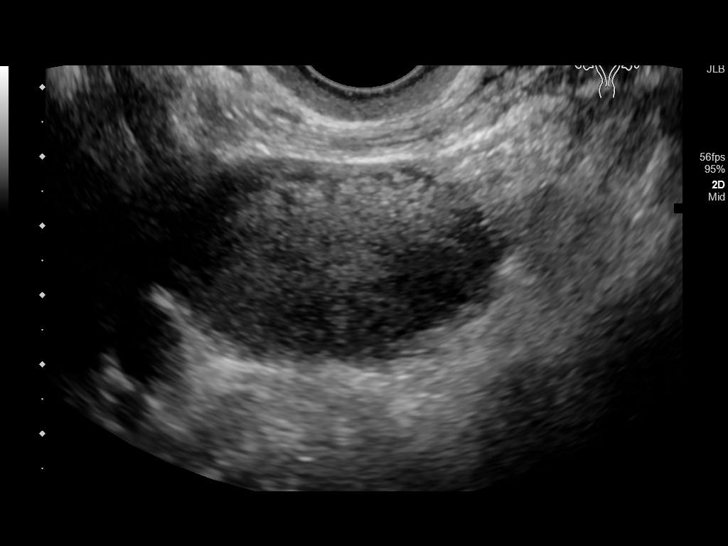
[im 65/77]
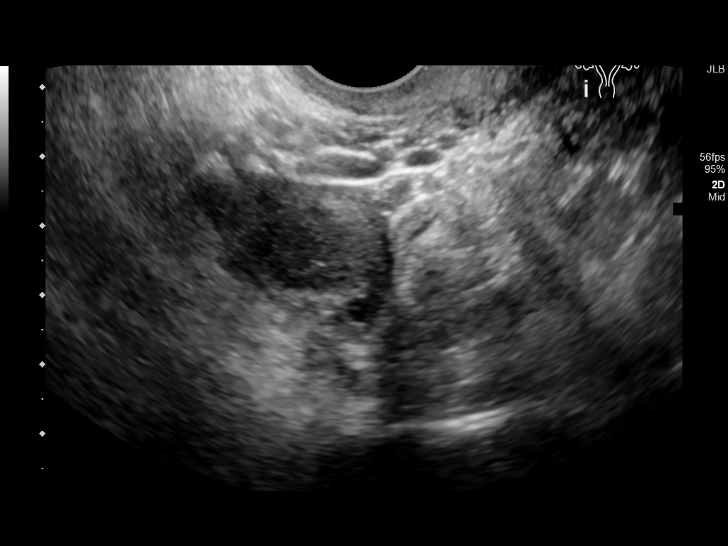
[im 71/77]
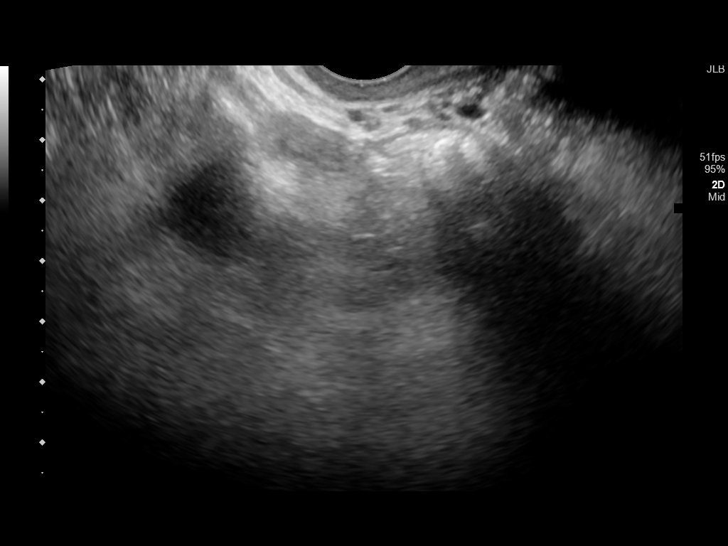
[im 77/77]
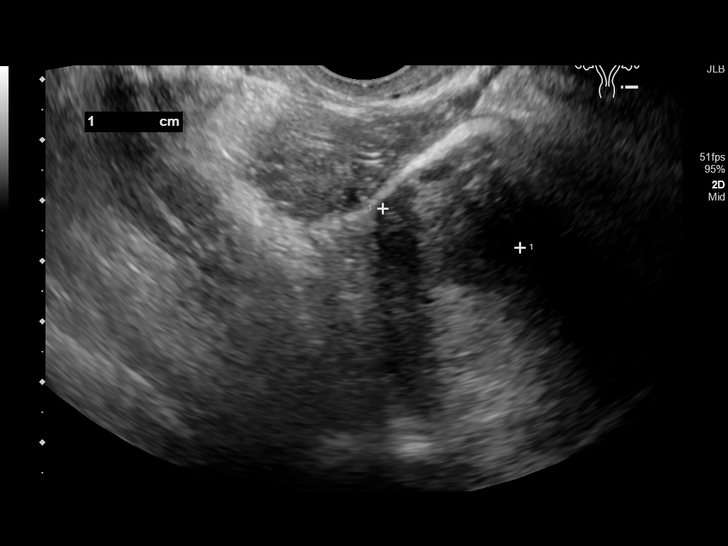

[14 of 28 positions shown; findings below may reference images not displayed]

FINDINGS: Intrauterine gestational sac: Single

Yolk sac:  Visualized

Embryo:  Visualized

Cardiac Activity: Visualized

Heart Rate: 92 bpm

MSD: 12.2 mm   6 w   0 d

CRL:  2.0 mm   5 w   5 d                  US EDC: 12/31/2019

Subchorionic hemorrhage:  None visualized.

Maternal uterus/adnexae: No adnexal mass or free fluid.
IMPRESSION: Five week 5 day intrauterine pregnancy by crown-rump length. Fetal
bradycardia of 92 beats per minute. This may be related to early
gestational age. This could be followed with repeat ultrasound in
10-14 days to ensure expected progression.

No acute maternal findings.

## 2021-05-22 ENCOUNTER — Encounter: Payer: Self-pay | Admitting: Dermatology

## 2021-05-22 ENCOUNTER — Ambulatory Visit (INDEPENDENT_AMBULATORY_CARE_PROVIDER_SITE_OTHER): Payer: 59 | Admitting: Dermatology

## 2021-05-22 ENCOUNTER — Other Ambulatory Visit: Payer: Self-pay

## 2021-05-22 DIAGNOSIS — B079 Viral wart, unspecified: Secondary | ICD-10-CM

## 2021-05-22 NOTE — Patient Instructions (Signed)

## 2021-05-22 NOTE — Progress Notes (Signed)
   New Patient Visit  Subjective  Victoria Brooks is a 25 y.o. female who presents for the following: Lesion (On the L lower leg - new, noticed it about 2 months ago, patient is concerned and would like it checked).  The following portions of the chart were reviewed this encounter and updated as appropriate:   Tobacco  Allergies  Meds  Problems  Med Hx  Surg Hx  Fam Hx     Review of Systems:  No other skin or systemic complaints except as noted in HPI or Assessment and Plan.  Objective  Well appearing patient in no apparent distress; mood and affect are within normal limits.  A focused examination was performed including L lower leg. Relevant physical exam findings are noted in the Assessment and Plan.  L pretibia x 1 Verrucous papules -- Discussed viral etiology and contagion.   Assessment & Plan  Viral warts, unspecified type L pretibia x 1  Discussed viral etiology and risk of spread.  Discussed multiple treatments may be required to clear warts.  Discussed possible post-treatment dyspigmentation and risk of recurrence.  Destruction of lesion - L pretibia x 1 Complexity: simple   Destruction method: cryotherapy   Informed consent: discussed and consent obtained   Timeout:  patient name, date of birth, surgical site, and procedure verified Lesion destroyed using liquid nitrogen: Yes   Region frozen until ice ball extended beyond lesion: Yes   Outcome: patient tolerated procedure well with no complications   Post-procedure details: wound care instructions given    Return if symptoms worsen or fail to improve.  Maylene Roes, CMA, am acting as scribe for Armida Sans, MD . Documentation: I have reviewed the above documentation for accuracy and completeness, and I agree with the above.  Armida Sans, MD

## 2021-05-29 ENCOUNTER — Encounter: Payer: Self-pay | Admitting: Nurse Practitioner

## 2021-05-29 ENCOUNTER — Ambulatory Visit (INDEPENDENT_AMBULATORY_CARE_PROVIDER_SITE_OTHER): Payer: 59 | Admitting: Nurse Practitioner

## 2021-05-29 ENCOUNTER — Other Ambulatory Visit: Payer: Self-pay

## 2021-05-29 VITALS — BP 90/74 | HR 82 | Temp 98.0°F | Resp 12 | Ht 66.0 in | Wt 136.0 lb

## 2021-05-29 DIAGNOSIS — F32A Depression, unspecified: Secondary | ICD-10-CM | POA: Diagnosis not present

## 2021-05-29 DIAGNOSIS — N63 Unspecified lump in unspecified breast: Secondary | ICD-10-CM | POA: Diagnosis not present

## 2021-05-29 DIAGNOSIS — Z803 Family history of malignant neoplasm of breast: Secondary | ICD-10-CM | POA: Diagnosis not present

## 2021-05-29 DIAGNOSIS — F419 Anxiety disorder, unspecified: Secondary | ICD-10-CM

## 2021-05-29 LAB — POCT URINE PREGNANCY: Preg Test, Ur: NEGATIVE

## 2021-05-29 MED ORDER — ESCITALOPRAM OXALATE 10 MG PO TABS: 10.0000 mg | ORAL_TABLET | Freq: Every day | ORAL | 2 refills | Status: DC

## 2021-05-29 NOTE — Patient Instructions (Signed)
Will get imaging ordered for your breast Start medication and follow up in 8 weeks (2 months). "988" behavorial health emergency line if needed

## 2021-05-29 NOTE — Progress Notes (Signed)
Established Patient Office Visit  Subjective:  Patient ID: Victoria Brooks, female    DOB: 1996/04/02  Age: 25 y.o. MRN: [id number]  CC:  Chief Complaint  Patient presents with   Transfer of Care    From [facility]   Breast Problem    Lump on top of left breast developed last week, it was the size of a golf ball but is better now. No pain. No redness. Had a spot on her left nipple this past weekend that "popped"     HPI [name] presents for transfer of care  Anxiety/Depression: history of same. Has had therapy in the past for several months without great benefit. She has tried several therapist in the past, same result. Has done well with lexapro in the past. Most recently managed on Effexor and Buspar. Patient states that she did not feel they were effective so discontinued them. States the anxiety does effect her life. Has been in her current position for 4 years and has anxiety in regards to work.  Breast lump: States approx 1 week ago her breast hurting then grew in size and the skin became red. Did not have fever or chills. Did not noticed a heading or discharge from the lump. Was not around the time of her cycle.Family history of breast cancer on maternal grandmother who was dx at age 9.  Past Medical History:  Diagnosis Date   Chicken pox     Past Surgical History:  Procedure Laterality Date   ADENOIDECTOMY     SHOULDER SURGERY Right    TONSILLECTOMY     WISDOM TOOTH EXTRACTION      Family History  Problem Relation Age of Onset   Heart disease Father    Hypertension Maternal Uncle    Cancer Maternal Grandmother        breast   Alzheimer's disease Maternal Grandfather     Social History   Socioeconomic History   Marital status: Single    Spouse name: Not on file   Number of children: Not on file   Years of education: Not on file   Highest education level: Not on file  Occupational History   Not on file  Tobacco Use   Smoking status: Former    Smokeless tobacco: Never  Vaping Use   Vaping Use: Every day  Substance and Sexual Activity   Alcohol use: Yes    Comment: occ   Drug use: Yes    Types: Marijuana   Sexual activity: Not on file  Other Topics Concern   Not on file  Social History Narrative   Parents Divorced   Mom is custodial parent but, dad remains very involved   Brother 1 year older   Mom works for Orthoptist   Social Determinants of Corporate investment banker Strain: Not on file  Food Insecurity: Not on file  Transportation Needs: Not on file  Physical Activity: Not on file  Stress: Not on file  Social Connections: Not on file  Intimate Partner Violence: Not on file    Outpatient Medications Prior to Visit  Medication Sig Dispense Refill   busPIRone (BUSPAR) 5 MG tablet TAKE 1 TABLET BY MOUTH TWICE A DAY 180 tablet 1   hydrOXYzine (ATARAX/VISTARIL) 10 MG tablet Take 1 tablet (10 mg total) by mouth 3 (three) times daily as needed. 20 tablet 0   ondansetron (ZOFRAN ODT) 8 MG disintegrating tablet Take 1 tablet (8 mg total) by mouth every 8 (eight) hours  as needed for nausea or vomiting. 20 tablet 0   venlafaxine XR (EFFEXOR-XR) 150 MG 24 hr capsule Take 1 capsule (150 mg total) by mouth daily with breakfast. 90 capsule 0   No facility-administered medications prior to visit.    No Known Allergies  ROS Review of Systems  Constitutional:  Negative for chills and fever.  Respiratory:  Negative for shortness of breath.   Cardiovascular:  Negative for chest pain.  Gastrointestinal:  Negative for nausea and vomiting.  Genitourinary:        Lump on left breast   Psychiatric/Behavioral:  Negative for agitation, confusion, hallucinations and suicidal ideas. The patient is not nervous/anxious.      Objective:    Physical Exam Vitals and nursing note reviewed. Exam conducted with a chaperone present [facility], CMA).  Constitutional:      Appearance: Normal appearance.  She is normal weight.  Cardiovascular:     Rate and Rhythm: Normal rate and regular rhythm.     Heart sounds: Normal heart sounds.  Pulmonary:     Effort: Pulmonary effort is normal.     Breath sounds: Normal breath sounds.  Chest:  Breasts:    Right: No nipple discharge, tenderness, axillary adenopathy or supraclavicular adenopathy.     Left: Mass present. No nipple discharge, tenderness, axillary adenopathy or supraclavicular adenopathy.       Comments: Nodule palpated beneath skin. Firm, non tender and mobile. Bruising noted where red circles are placed. Patient states they are "hickies" Abdominal:     General: Bowel sounds are normal.  Lymphadenopathy:     Upper Body:     Right upper body: No supraclavicular or axillary adenopathy.     Left upper body: No supraclavicular or axillary adenopathy.  Skin:    General: Skin is warm.     Findings: Bruising present.  Neurological:     Mental Status: She is alert.  Psychiatric:        Mood and Affect: Mood normal.        Behavior: Behavior normal.        Thought Content: Thought content normal.        Judgment: Judgment normal.    BP 90/74   Pulse 82   Temp 98 F (36.7 C)   Resp 12   Ht 5\' 6"  (1.676 m) Comment: updated today  Wt 136 lb (61.7 kg)   LMP 05/08/2021   SpO2 98%   BMI 21.95 kg/m  Wt Readings from Last 3 Encounters:  05/29/21 136 lb (61.7 kg)  04/10/20 129 lb (58.5 kg)  04/04/20 140 lb (63.5 kg)     Health Maintenance Due  Topic Date Due   HPV VACCINES (1 - 2-dose series) Never done   HIV Screening  Never done   Hepatitis C Screening  Never done   TETANUS/TDAP  05/20/2019   COVID-19 Vaccine (3 - Booster for Pfizer series) 06/19/2020   INFLUENZA VACCINE  05/21/2021       Topic Date Due   HPV VACCINES (1 - 2-dose series) Never done    Lab Results  Component Value Date   TSH 1.12 12/08/2018   Lab Results  Component Value Date   WBC 6.8 02/10/2020   HGB 14.0 02/10/2020   HCT 41.4 02/10/2020    MCV 88.1 02/10/2020   PLT 260.0 02/10/2020   Lab Results  Component Value Date   NA 139 02/10/2020   K 3.8 02/10/2020   CO2 31 02/10/2020   GLUCOSE 64 (L)  02/10/2020   BUN 8 02/10/2020   CREATININE 0.73 02/10/2020   BILITOT 0.3 02/10/2020   ALKPHOS 41 02/10/2020   AST 16 02/10/2020   ALT 10 02/10/2020   PROT 6.7 02/10/2020   ALBUMIN 3.8 02/10/2020   CALCIUM 9.0 02/10/2020   ANIONGAP 12 09/13/2019   GFR 99.24 02/10/2020   Lab Results  Component Value Date   CHOL 179 02/10/2020   Lab Results  Component Value Date   HDL 60.50 02/10/2020   Lab Results  Component Value Date   LDLCALC 105 (H) 12/08/2018   Lab Results  Component Value Date   TRIG 208.0 (H) 02/10/2020   Lab Results  Component Value Date   CHOLHDL 3 02/10/2020   No results found for: HGBA1C    Assessment & Plan:   Problem List Items Addressed This Visit       Other   Anxiety and depression - Primary    History of the same.  No HI/SI/AVH.  Patient most recently maintained on Effexor and BuSpar.  Patient states did not have affect so she discontinued medications on her own.  Did administer PHQ-9 and GAD-7 today.  Given results and talking with patient will start treatment.  States she has been on Lexapro in the past and has done well.  Negative pregnancy test in office. Start Lexapro 10 mg daily.  Follow-up in 2 months       Relevant Medications   escitalopram (LEXAPRO) 10 MG tablet   Other Relevant Orders   POCT urine pregnancy (Completed)   Family history of breast cancer    Maternal grandmother diagnosed at age 57.       Relevant Orders   US BREAST COMPLETE UNI LEFT INC AXILLA   Breast lump in female    In upper outer left breast approximately 1 week ago.  Patient does have remote family history of maternal grandmother diagnosed with breast cancer at age 88.  Will obtain ultrasound of left breast.  Pending result.        No orders of the defined types were placed in this  encounter.   Follow-up: Return in about 2 months (around 07/29/2021) for recheck on medication and CPE.   This visit occurred during the SARS-CoV-2 public health emergency.  Safety protocols were in place, including screening questions prior to the visit, additional usage of staff PPE, and extensive cleaning of exam room while observing appropriate contact time as indicated for disinfecting solutions.   Audria Nine, NP

## 2021-05-29 NOTE — Assessment & Plan Note (Signed)
In upper outer left breast approximately 1 week ago.  Patient does have remote family history of maternal grandmother diagnosed with breast cancer at age 24.  Will obtain ultrasound of left breast.  Pending result.

## 2021-05-29 NOTE — Assessment & Plan Note (Signed)
History of the same.  No HI/SI/AVH.  Patient most recently maintained on Effexor and BuSpar.  Patient states did not have affect so she discontinued medications on her own.  Did administer PHQ-9 and GAD-7 today.  Given results and talking with patient will start treatment.  States she has been on Lexapro in the past and has done well.  Negative pregnancy test in office. Start Lexapro 10 mg daily.  Follow-up in 2 months

## 2021-05-29 NOTE — Assessment & Plan Note (Signed)
Maternal grandmother diagnosed at age 24.

## 2021-07-30 ENCOUNTER — Encounter: Payer: 59 | Admitting: Nurse Practitioner

## 2021-07-30 ENCOUNTER — Telehealth: Payer: Self-pay | Admitting: Nurse Practitioner

## 2022-01-10 NOTE — Telephone Encounter (Signed)
error 

## 2022-12-16 ENCOUNTER — Encounter: Payer: Self-pay | Admitting: *Deleted

## 2022-12-16 DIAGNOSIS — D72829 Elevated white blood cell count, unspecified: Secondary | ICD-10-CM | POA: Insufficient documentation

## 2022-12-16 DIAGNOSIS — R112 Nausea with vomiting, unspecified: Secondary | ICD-10-CM | POA: Diagnosis present

## 2022-12-16 DIAGNOSIS — R059 Cough, unspecified: Secondary | ICD-10-CM | POA: Insufficient documentation

## 2022-12-16 DIAGNOSIS — Z20822 Contact with and (suspected) exposure to covid-19: Secondary | ICD-10-CM | POA: Diagnosis not present

## 2022-12-16 DIAGNOSIS — R197 Diarrhea, unspecified: Secondary | ICD-10-CM | POA: Insufficient documentation

## 2022-12-16 LAB — CBC
HCT: 49.3 % — ABNORMAL HIGH (ref 36.0–46.0)
Hemoglobin: 17.1 g/dL — ABNORMAL HIGH (ref 12.0–15.0)
MCH: 30.3 pg (ref 26.0–34.0)
MCHC: 34.7 g/dL (ref 30.0–36.0)
MCV: 87.3 fL (ref 80.0–100.0)
Platelets: 259 10*3/uL (ref 150–400)
RBC: 5.65 MIL/uL — ABNORMAL HIGH (ref 3.87–5.11)
RDW: 13.2 % (ref 11.5–15.5)
WBC: 13.4 10*3/uL — ABNORMAL HIGH (ref 4.0–10.5)
nRBC: 0 % (ref 0.0–0.2)

## 2022-12-16 LAB — COMPREHENSIVE METABOLIC PANEL
ALT: 11 U/L (ref 0–44)
AST: 19 U/L (ref 15–41)
Albumin: 4.3 g/dL (ref 3.5–5.0)
Alkaline Phosphatase: 42 U/L (ref 38–126)
Anion gap: 15 (ref 5–15)
BUN: 13 mg/dL (ref 6–20)
CO2: 21 mmol/L — ABNORMAL LOW (ref 22–32)
Calcium: 9.8 mg/dL (ref 8.9–10.3)
Chloride: 99 mmol/L (ref 98–111)
Creatinine, Ser: 1.08 mg/dL — ABNORMAL HIGH (ref 0.44–1.00)
GFR, Estimated: >60 mL/min (ref >60)
Glucose, Bld: 162 mg/dL — ABNORMAL HIGH (ref 70–99)
Potassium: 4.1 mmol/L (ref 3.5–5.1)
Sodium: 135 mmol/L (ref 135–145)
Total Bilirubin: 1.1 mg/dL (ref 0.3–1.2)
Total Protein: 8.1 g/dL (ref 6.5–8.1)

## 2022-12-16 LAB — LIPASE, BLOOD: Lipase: 30 U/L (ref 11–51)

## 2022-12-16 MED ORDER — ONDANSETRON 4 MG PO TBDP
4.0000 mg | ORAL_TABLET | Freq: Once | ORAL | Status: AC
  Administered 2022-12-17: 4 mg via ORAL
  Filled 2022-12-16: qty 1

## 2022-12-16 NOTE — ED Triage Notes (Signed)
Pt reports vomiting for several days.  No diarrhea.  Pt has abd pain.  Pt pale.  Pt also reports a cough since yesterday.  Pt states she ate food 2 days ago and got sick afterwards.  Pt alert.

## 2022-12-17 ENCOUNTER — Emergency Department
Admission: EM | Admit: 2022-12-17 | Discharge: 2022-12-17 | Disposition: A | Discharge status: Home / Self Care | Payer: Commercial Managed Care - PPO | Attending: Emergency Medicine | Admitting: Emergency Medicine

## 2022-12-17 ENCOUNTER — Emergency Department: Payer: Commercial Managed Care - PPO

## 2022-12-17 DIAGNOSIS — R112 Nausea with vomiting, unspecified: Secondary | ICD-10-CM

## 2022-12-17 LAB — URINALYSIS, ROUTINE W REFLEX MICROSCOPIC
Bilirubin Urine: NEGATIVE
Glucose, UA: 50 mg/dL — AB
Ketones, ur: 80 mg/dL — AB
Leukocytes,Ua: NEGATIVE
Nitrite: NEGATIVE
Protein, ur: 100 mg/dL — AB
Specific Gravity, Urine: 1.029 (ref 1.005–1.030)
pH: 5 (ref 5.0–8.0)

## 2022-12-17 LAB — PREGNANCY, URINE: Preg Test, Ur: NEGATIVE

## 2022-12-17 LAB — RESP PANEL BY RT-PCR (RSV, FLU A&B, COVID)  RVPGX2
Influenza A by PCR: NEGATIVE
Influenza B by PCR: NEGATIVE
Resp Syncytial Virus by PCR: NEGATIVE
SARS Coronavirus 2 by RT PCR: NEGATIVE

## 2022-12-17 MED ORDER — SODIUM CHLORIDE 0.9 % IV BOLUS (SEPSIS)
1000.0000 mL | Freq: Once | INTRAVENOUS | Status: AC
  Administered 2022-12-17: 1000 mL via INTRAVENOUS

## 2022-12-17 MED ORDER — ONDANSETRON HCL 4 MG/2ML IJ SOLN
4.0000 mg | Freq: Once | INTRAMUSCULAR | Status: AC
  Administered 2022-12-17: 4 mg via INTRAVENOUS
  Filled 2022-12-17: qty 2

## 2022-12-17 MED ORDER — KETOROLAC TROMETHAMINE 30 MG/ML IJ SOLN
30.0000 mg | Freq: Once | INTRAMUSCULAR | Status: AC
  Administered 2022-12-17: 30 mg via INTRAVENOUS
  Filled 2022-12-17: qty 1

## 2022-12-17 MED ORDER — DICYCLOMINE HCL 20 MG PO TABS: 20.0000 mg | ORAL_TABLET | Freq: Three times a day (TID) | ORAL | 0 refills | Status: DC | PRN

## 2022-12-17 MED ORDER — ONDANSETRON 4 MG PO TBDP: 4.0000 mg | ORAL_TABLET | Freq: Four times a day (QID) | ORAL | 0 refills | Status: DC | PRN

## 2022-12-17 NOTE — ED Provider Notes (Signed)
[facility] Provider Note    Event Date/Time   First MD Initiated Contact with Patient 12/17/22 0009     (approximate)   History   Emesis   HPI  Victoria Brooks is a 27 y.o. female with no significant past medical history who presents to the emergency department with mother for complaints of nausea, vomiting, diarrhea and cough.  No known fevers.  Complains of diffuse abdominal tenderness that started after multiple episodes of vomiting.  No dysuria, hematuria, vaginal bleeding or discharge.  No previous abdominal surgeries.   History provided by patient, mother.    Past Medical History:  Diagnosis Date   Chicken pox     Past Surgical History:  Procedure Laterality Date   ADENOIDECTOMY     SHOULDER SURGERY Right    TONSILLECTOMY     WISDOM TOOTH EXTRACTION      MEDICATIONS:  Prior to Admission medications   Medication Sig Start Date End Date Taking? Authorizing Provider  escitalopram (LEXAPRO) 10 MG tablet Take 1 tablet (10 mg total) by mouth daily. 05/29/21   Michela Pitcher, NP    Physical Exam   Triage Vital Signs: ED Triage Vitals  Enc Vitals Group     BP 12/16/22 2101 (!) 119/93     Pulse Rate 12/16/22 2101 (!) 105     Resp 12/16/22 2101 18     Temp 12/16/22 2108 (!) 97.5 F (36.4 C)     Temp Source 12/16/22 2101 Oral     SpO2 12/16/22 2101 98 %     Weight 12/16/22 2105 110 lb (49.9 kg)     Height 12/16/22 2105 '5\' 6"'$  (1.676 m)     Head Circumference --      Peak Flow --      Pain Score 12/16/22 2105 4     Pain Loc --      Pain Edu? --      Excl. in [place]? --     Most recent vital signs: Vitals:   12/16/22 2108 12/17/22 0306  BP:  121/86  Pulse:  95  Resp:  18  Temp: (!) 97.5 F (36.4 C) 98 F (36.7 C)  SpO2:  98%    CONSTITUTIONAL: Alert, responds appropriately to questions.  Appears uncomfortable HEAD: Normocephalic, atraumatic EYES: Conjunctivae clear, pupils appear equal, sclera nonicteric ENT: normal nose; dry  mucous membranes NECK: Supple, normal ROM CARD: RRR; S1 and S2 appreciated RESP: Normal chest excursion without splinting or tachypnea; breath sounds clear and equal bilaterally; no wheezes, no rhonchi, no rales, no hypoxia or respiratory distress, speaking full sentences ABD/GI: Non-distended; soft, non-tender, no rebound, no guarding, no peritoneal signs, no tenderness at McBurney's point BACK: The back appears normal EXT: Normal ROM in all joints; no deformity noted, no edema SKIN: Normal color for age and race; warm; no rash on exposed skin NEURO: Moves all extremities equally, normal speech PSYCH: The patient's mood and manner are appropriate.   ED Results / Procedures / Treatments   LABS: (all labs ordered are listed, but only abnormal results are displayed) Labs Reviewed  COMPREHENSIVE METABOLIC PANEL - Abnormal; Notable for the following components:      Result Value   CO2 21 (*)    Glucose, Bld 162 (*)    Creatinine, Ser 1.08 (*)    All other components within normal limits  CBC - Abnormal; Notable for the following components:   WBC 13.4 (*)    RBC 5.65 (*)  Hemoglobin 17.1 (*)    HCT 49.3 (*)    All other components within normal limits  URINALYSIS, ROUTINE W REFLEX MICROSCOPIC - Abnormal; Notable for the following components:   Color, Urine YELLOW (*)    APPearance CLOUDY (*)    Glucose, UA 50 (*)    Hgb urine dipstick MODERATE (*)    Ketones, ur 80 (*)    Protein, ur 100 (*)    Bacteria, UA RARE (*)    All other components within normal limits  RESP PANEL BY RT-PCR (RSV, FLU A&B, COVID)  [id number]  LIPASE, BLOOD  PREGNANCY, URINE     EKG:  RADIOLOGY: My personal review and interpretation of imaging: Chest x-ray clear.  I have personally reviewed all radiology reports.   DG Chest Portable 1 View  Result Date: 12/17/2022 CLINICAL DATA:  Abdominal pain and cough. EXAM: PORTABLE CHEST 1 VIEW COMPARISON:  August 01, 2012 FINDINGS: The heart size and  mediastinal contours are within normal limits. Both lungs are clear. Bilateral radiopaque nipple piercings are seen. There is mild dextroscoliosis of the lower thoracic spine. The visualized skeletal structures are otherwise unremarkable. IMPRESSION: No active disease. Electronically Signed   By: Virgina Norfolk M.D.   On: 12/17/2022 01:27     PROCEDURES:  Critical Care performed: No      Procedures    IMPRESSION / MDM / ASSESSMENT AND PLAN / ED COURSE  I reviewed the triage vital signs and the nursing notes.    Patient here for cough, nausea, vomiting, diarrhea.    DIFFERENTIAL DIAGNOSIS (includes but not limited to):   Viral gastroenteritis, dehydration, viral URI, pneumonia, UTI, less likely appendicitis, colitis, diverticulitis, pancreatitis, cholecystitis based on benign abdominal exam.   Patient's presentation is most consistent with acute complicated illness / injury requiring diagnostic workup.   PLAN: Labs show leukocytosis of 13,000.  Bicarb of 21 but normal anion gap.  Slightly elevated creatinine of 1.08 all suggestive of dehydration.  Normal LFTs and lipase.  Will obtain COVID and flu swab, urine and chest x-ray.  Will give IV fluids, Toradol and Zofran.  Given benign abdominal exam, no indication currently for CT of the abdomen pelvis but will perform serial exams and monitor closely.   MEDICATIONS GIVEN IN ED: Medications  ondansetron (ZOFRAN-ODT) disintegrating tablet 4 mg (4 mg Oral Given 12/17/22 0011)  sodium chloride 0.9 % bolus 1,000 mL (0 mLs Intravenous Stopped 12/17/22 0143)  sodium chloride 0.9 % bolus 1,000 mL (0 mLs Intravenous Stopped 12/17/22 0143)  ondansetron (ZOFRAN) injection 4 mg (4 mg Intravenous Given 12/17/22 0051)  ketorolac (TORADOL) 30 MG/ML injection 30 mg (30 mg Intravenous Given 12/17/22 0051)     ED COURSE: COVID, flu and RSV negative.  Urine shows large ketones but no sign of infection.  Pregnancy test negative.  Chest x-ray  reviewed and interpreted by myself and the radiologist and is unremarkable.  On reassessment patient states she is feeling "200 times better" and is tolerating p.o.  No longer having abdominal pain.  I feel she is safe for discharge.  Suspect viral illness causing her symptoms today.  Recommended bland diet for several days and discussed supportive care instructions.  Will discharge with Zofran.  At this time, I do not feel there is any life-threatening condition present. I reviewed all nursing notes, vitals, pertinent previous records.  All lab and urine results, EKGs, imaging ordered have been independently reviewed and interpreted by myself.  I reviewed all available radiology reports from any  imaging ordered this visit.  Based on my assessment, I feel the patient is safe to be discharged home without further emergent workup and can continue workup as an outpatient as needed. Discussed all findings, treatment plan as well as usual and customary return precautions.  They verbalize understanding and are comfortable with this plan.  Outpatient follow-up has been provided as needed.  All questions have been answered.    CONSULTS:  none   OUTSIDE RECORDS REVIEWED: Reviewed last PCP visit on 8 9/22.       FINAL CLINICAL IMPRESSION(S) / ED DIAGNOSES   Final diagnoses:  Nausea vomiting and diarrhea     Rx / DC Orders   ED Discharge Orders          Ordered    ondansetron (ZOFRAN-ODT) 4 MG disintegrating tablet  Every 6 hours PRN        12/17/22 0311    dicyclomine (BENTYL) 20 MG tablet  Every 8 hours PRN        12/17/22 [id number]             Note:  This document was prepared using Dragon voice recognition software and may include unintentional dictation errors.   Wynonia Medero, Delice Bison, DO 12/17/22 [phone]

## 2022-12-17 NOTE — Discharge Instructions (Signed)
You may alternate Tylenol 1000 mg every 6 hours as needed for pain, fever and Ibuprofen 800 mg every 6-8 hours as needed for pain, fever.  Please take Ibuprofen with food.  Do not take more than 4000 mg of Tylenol (acetaminophen) in a 24 hour period.  You may take vitamin over-the-counter as needed for diarrhea.  I recommend a bland diet for the next several days.  Your labs, urine, chest x-ray were reassuring and normal today.  COVID, flu and RSV negative.  I suspect that you have a viral gastroenteritis causing your symptoms.

## 2022-12-17 NOTE — ED Notes (Signed)
Patient passed PO challenge.

## 2022-12-27 ENCOUNTER — Ambulatory Visit (INDEPENDENT_AMBULATORY_CARE_PROVIDER_SITE_OTHER): Payer: Commercial Managed Care - PPO | Admitting: Family

## 2022-12-27 ENCOUNTER — Encounter: Payer: Self-pay | Admitting: Family

## 2022-12-27 ENCOUNTER — Other Ambulatory Visit: Payer: Self-pay | Admitting: Family

## 2022-12-27 VITALS — BP 112/64 | HR 66 | Ht 66.0 in | Wt 110.0 lb

## 2022-12-27 DIAGNOSIS — F32A Depression, unspecified: Secondary | ICD-10-CM | POA: Diagnosis not present

## 2022-12-27 DIAGNOSIS — F419 Anxiety disorder, unspecified: Secondary | ICD-10-CM

## 2022-12-27 MED ORDER — AUVELITY 45-105 MG PO TBCR: 1.0000 | EXTENDED_RELEASE_TABLET | Freq: Two times a day (BID) | ORAL | 5 refills | Status: DC

## 2022-12-27 MED ORDER — CLONAZEPAM 1 MG PO TABS: 1.0000 mg | ORAL_TABLET | Freq: Three times a day (TID) | ORAL | 2 refills | Status: DC | PRN

## 2022-12-27 NOTE — Progress Notes (Signed)
Established Patient Office Visit  Subjective:  Patient ID: Victoria Brooks, female    DOB: 31-Jul-1996  Age: 27 y.o. MRN: [id number]  Chief Complaint  Patient presents with   Follow-up    3 month follow up    Patient is here for her 3 month f/u.   She has been feeling well since her last appointment.  She did recently have a stomach bug that she went to the ED for fluids due to. She lost a considerable amount of weight during that time.   Due for labs No other concerns at this time  Anxiety Presents for follow-up visit. Symptoms include chest pain, decreased concentration, depressed mood, excessive worry, hyperventilation, nervous/anxious behavior and panic. Symptoms occur most days. The most recent episode lasted 3 hours. The severity of symptoms is causing significant distress, severe, interfering with daily activities and incapacitating. The quality of sleep is good. Nighttime awakenings: none.   Her past medical history is significant for depression. Compliance with medications is 76-100%.  Depression      The patient presents with depression.  This is a chronic problem.  The current episode started more than 1 year ago.   The onset quality is undetermined.   The problem occurs intermittently.  The problem has been rapidly improving since onset.  Associated symptoms include decreased concentration and fatigue.     The symptoms are aggravated by work stress.  Past treatments include other medications.  Compliance with treatment is good.  Past compliance problems include insurance issues (now resolved, patient is taking auvelity regularly.).  Risk factors include a recent illness.   Past medical history includes recent illness, anxiety and depression.      Past Medical History:  Diagnosis Date   Chicken pox     Past Surgical History:  Procedure Laterality Date   ADENOIDECTOMY     SHOULDER SURGERY Right    TONSILLECTOMY     WISDOM TOOTH EXTRACTION      Social History    Socioeconomic History   Marital status: Single    Spouse name: Not on file   Number of children: Not on file   Years of education: Not on file   Highest education level: Not on file  Occupational History   Not on file  Tobacco Use   Smoking status: Former    Years: 3.00    Types: Cigarettes    Quit date: 2019    Years since quitting: 5.1   Smokeless tobacco: Never  Vaping Use   Vaping Use: Every day  Substance and Sexual Activity   Alcohol use: Yes    Comment: occ   Drug use: Yes    Types: Marijuana   Sexual activity: Yes    Birth control/protection: Condom    Comment: states just got RX for [facility]  Other Topics Concern   Not on file  Social History Narrative   Parents Divorced   Mom is custodial parent but, dad remains very involved   Brother 1 year older   Mom works for Physicist, medical   Social Determinants of Radio broadcast assistant Strain: Not on file  Food Insecurity: Not on file  Transportation Needs: Not on file  Physical Activity: Not on file  Stress: Not on file  Social Connections: Not on file  Intimate Partner Violence: Not on file    Family History  Problem Relation Age of Onset   Heart disease Father    Hypertension Maternal Uncle    Cancer Maternal  Grandmother 52       breast   Alzheimer's disease Maternal Grandfather     No Known Allergies  Review of Systems  Constitutional:  Positive for fatigue.  Cardiovascular:  Positive for chest pain.  Psychiatric/Behavioral:  Positive for decreased concentration and depression. The patient is nervous/anxious.        Objective:   BP 112/64   Pulse 66   Ht '5\' 6"'$  ([id number] m)   Wt 110 lb (49.9 kg)   LMP 12/02/2022 (Approximate)   SpO2 98%   BMI 17.75 kg/m   Vitals:   12/27/22 1000  BP: 112/64  Pulse: 66  Height: '5\' 6"'$  (1.676 m)  Weight: 110 lb (49.9 kg)  SpO2: 98%  BMI (Calculated): 17.76    Physical Exam   No results found for any visits on 12/27/22.  Recent  Results (from the past 2160 hour(s))  Lipase, blood     Status: None   Collection Time: 12/16/22  9:09 PM  Result Value Ref Range   Lipase 30 11 - 51 U/L    Comment: Performed at [facility], [address] [place], [place] [address]  Comprehensive metabolic panel     Status: Abnormal   Collection Time: 12/16/22  9:09 PM  Result Value Ref Range   Sodium 135 135 - 145 mmol/L   Potassium 4.1 3.5 - 5.1 mmol/L   Chloride 99 98 - 111 mmol/L   CO2 21 (L) 22 - 32 mmol/L   Glucose, Bld 162 (H) 70 - 99 mg/dL    Comment: Glucose reference range applies only to samples taken after fasting for at least 8 hours.   BUN 13 6 - 20 mg/dL   Creatinine, Ser 1.08 (H) 0.44 - 1.00 mg/dL   Calcium 9.8 8.9 - 10.3 mg/dL   Total Protein 8.1 6.5 - 8.1 g/dL   Albumin 4.3 3.5 - 5.0 g/dL   AST 19 15 - 41 U/L   ALT 11 0 - 44 U/L   Alkaline Phosphatase 42 38 - 126 U/L   Total Bilirubin 1.1 0.3 - 1.2 mg/dL   GFR, Estimated >60 >60 mL/min    Comment: (NOTE) Calculated using the CKD-EPI Creatinine Equation (2021)    Anion gap 15 5 - 15    Comment: Performed at [facility], [address] [place], [place] [address]  CBC     Status: Abnormal   Collection Time: 12/16/22  9:09 PM  Result Value Ref Range   WBC 13.4 (H) 4.0 - 10.5 K/uL   RBC 5.65 (H) 3.87 - 5.11 MIL/uL   Hemoglobin 17.1 (H) 12.0 - 15.0 g/dL   HCT 49.3 (H) 36.0 - 46.0 %   MCV 87.3 80.0 - 100.0 fL   MCH 30.3 26.0 - 34.0 pg   MCHC 34.7 30.0 - 36.0 g/dL   RDW 13.2 11.5 - 15.5 %   Platelets 259 150 - 400 K/uL   nRBC 0.0 0.0 - 0.2 %    Comment: Performed at [facility], [address], [place], [place] [address]  Resp panel by RT-PCR (RSV, Flu A&B, Covid) Anterior Nasal Swab     Status: None   Collection Time: 12/17/22 12:36 AM   Specimen: Anterior Nasal Swab  Result Value Ref Range   SARS Coronavirus 2 by RT PCR NEGATIVE NEGATIVE    Comment: (NOTE) SARS-CoV-2 target nucleic acids are NOT DETECTED.  The  SARS-CoV-2 RNA is generally detectable in upper respiratory specimens during the acute phase of  infection. The lowest concentration of SARS-CoV-2 viral copies this assay can detect is 138 copies/mL. A negative result does not preclude SARS-Cov-2 infection and should not be used as the sole basis for treatment or other patient management decisions. A negative result may occur with  improper specimen collection/handling, submission of specimen other than nasopharyngeal swab, presence of viral mutation(s) within the areas targeted by this assay, and inadequate number of viral copies(<138 copies/mL). A negative result must be combined with clinical observations, patient history, and epidemiological information. The expected result is Negative.  Fact Sheet for Patients:  EntrepreneurPulse.com.au  Fact Sheet for Healthcare Providers:  IncredibleEmployment.be  This test is no t yet approved or cleared by the [place] FDA and  has been authorized for detection and/or diagnosis of SARS-CoV-2 by FDA under an Emergency Use Authorization (EUA). This EUA will remain  in effect (meaning this test can be used) for the duration of the COVID-19 declaration under Section 564(b)(1) of the Act, 21 U.S.C.section 360bbb-3(b)(1), unless the authorization is terminated  or revoked sooner.       Influenza A by PCR NEGATIVE NEGATIVE   Influenza B by PCR NEGATIVE NEGATIVE    Comment: (NOTE) The Xpert Xpress SARS-CoV-2/FLU/RSV plus assay is intended as an aid in the diagnosis of influenza from Nasopharyngeal swab specimens and should not be used as a sole basis for treatment. Nasal washings and aspirates are unacceptable for Xpert Xpress SARS-CoV-2/FLU/RSV testing.  Fact Sheet for Patients: EntrepreneurPulse.com.au  Fact Sheet for Healthcare Providers: IncredibleEmployment.be  This test is not yet approved or cleared by the  [place] FDA and has been authorized for detection and/or diagnosis of SARS-CoV-2 by FDA under an Emergency Use Authorization (EUA). This EUA will remain in effect (meaning this test can be used) for the duration of the COVID-19 declaration under Section 564(b)(1) of the Act, 21 U.S.C. section 360bbb-3(b)(1), unless the authorization is terminated or revoked.     Resp Syncytial Virus by PCR NEGATIVE NEGATIVE    Comment: (NOTE) Fact Sheet for Patients: EntrepreneurPulse.com.au  Fact Sheet for Healthcare Providers: IncredibleEmployment.be  This test is not yet approved or cleared by the [place] FDA and has been authorized for detection and/or diagnosis of SARS-CoV-2 by FDA under an Emergency Use Authorization (EUA). This EUA will remain in effect (meaning this test can be used) for the duration of the COVID-19 declaration under Section 564(b)(1) of the Act, 21 U.S.C. section 360bbb-3(b)(1), unless the authorization is terminated or revoked.  Performed at [facility], [address] [place], [place] [address]   Urinalysis, Routine w reflex microscopic -Urine, Clean Catch     Status: Abnormal   Collection Time: 12/17/22  2:00 AM  Result Value Ref Range   Color, Urine YELLOW (A) YELLOW   APPearance CLOUDY (A) CLEAR   Specific Gravity, Urine 1.029 1.005 - 1.030   pH 5.0 5.0 - 8.0   Glucose, UA 50 (A) NEGATIVE mg/dL   Hgb urine dipstick MODERATE (A) NEGATIVE   Bilirubin Urine NEGATIVE NEGATIVE   Ketones, ur 80 (A) NEGATIVE mg/dL   Protein, ur 100 (A) NEGATIVE mg/dL   Nitrite NEGATIVE NEGATIVE   Leukocytes,Ua NEGATIVE NEGATIVE   RBC / HPF 0-5 0 - 5 RBC/hpf   WBC, UA 0-5 0 - 5 WBC/hpf   Bacteria, UA RARE (A) NONE SEEN   Squamous Epithelial / HPF 0-5 0 - 5 /HPF   Mucus PRESENT     Comment: Performed at [facility], [address],  [place], [place] [address]  Pregnancy, urine     Status: None   Collection Time:  12/17/22  2:00 AM  Result Value Ref Range   Preg Test, Ur NEGATIVE NEGATIVE    Comment: Performed at [facility], [address] [place], [place] [address]      Assessment & Plan:   Problem List Items Addressed This Visit     Anxiety and depression - Primary   Relevant Medications   clonazePAM (KLONOPIN) 1 MG tablet   AUVELITY 45-105 MG TBCR    Return in about 3 months (around 03/29/2023) for F/U.   Total time spent: 20 minutes  Mechele Claude, FNP  12/27/2022

## 2022-12-27 NOTE — Assessment & Plan Note (Signed)
Patient has been able to get the [facility] finally, and is taking it as prescribed.  She is also taking the clonazepam as needed, says that she does not always take 3 daily, but that she feels that her anxiety/depression are well controlled.  Continue current meds.

## 2023-03-31 ENCOUNTER — Ambulatory Visit: Payer: Commercial Managed Care - PPO | Admitting: Family

## 2023-04-22 ENCOUNTER — Ambulatory Visit (INDEPENDENT_AMBULATORY_CARE_PROVIDER_SITE_OTHER): Payer: Commercial Managed Care - PPO | Admitting: Family

## 2023-04-22 DIAGNOSIS — F419 Anxiety disorder, unspecified: Secondary | ICD-10-CM

## 2023-04-22 DIAGNOSIS — F32A Depression, unspecified: Secondary | ICD-10-CM | POA: Diagnosis not present

## 2023-04-22 MED ORDER — CLONAZEPAM 1 MG PO TABS: 1.0000 mg | ORAL_TABLET | Freq: Three times a day (TID) | ORAL | 2 refills | Status: DC | PRN

## 2023-04-22 MED ORDER — AUVELITY 45-105 MG PO TBCR: 1.0000 | EXTENDED_RELEASE_TABLET | Freq: Two times a day (BID) | ORAL | 5 refills | Status: DC

## 2023-04-22 NOTE — Progress Notes (Signed)
Established Patient Office Visit  Subjective:  Patient ID: Victoria Brooks, female    DOB: 04/25/1996  Age: 27 y.o. MRN: [id number]  Chief Complaint  Patient presents with   Follow-up    3 mo F/U    Patient is here today for her 3 months follow up.  She has been feeling well since last appointment.   She does not have additional concerns to discuss today.  Labs are not due today. She needs refills.   I have reviewed her active problem list, medication list, allergies, notes from last encounter, lab results for her appointment today.   No other concerns at this time.   Past Medical History:  Diagnosis Date   Chicken pox     Past Surgical History:  Procedure Laterality Date   ADENOIDECTOMY     SHOULDER SURGERY Right    TONSILLECTOMY     WISDOM TOOTH EXTRACTION      Social History   Socioeconomic History   Marital status: Single    Spouse name: Not on file   Number of children: Not on file   Years of education: Not on file   Highest education level: Not on file  Occupational History   Not on file  Tobacco Use   Smoking status: Former    Years: 3    Types: Cigarettes    Quit date: 2019    Years since quitting: 5.5   Smokeless tobacco: Never  Vaping Use   Vaping Use: Every day  Substance and Sexual Activity   Alcohol use: Yes    Comment: occ   Drug use: Yes    Types: Marijuana   Sexual activity: Yes    Birth control/protection: Condom    Comment: states just got RX for [facility]  Other Topics Concern   Not on file  Social History Narrative   Parents Divorced   Mom is custodial parent but, dad remains very involved   Brother 1 year older   Mom works for Orth[employer]   Social Determinants of Corporate investment banker Strain: Not on file  Food Insecurity: Not on file  Transportation Needs: Not on file  Physical Activity: Not on file  Stress: Not on file  Social Connections: Not on file  Intimate Partner Violence: Not on file     Family History  Problem Relation Age of Onset   Heart disease Father    Hypertension Maternal Uncle    Cancer Maternal Grandmother 52       breast   Alzheimer's disease Maternal Grandfather     No Known Allergies  Review of Systems  All other systems reviewed and are negative.      Objective:   BP 100/69   Pulse 98   Ht 5\' 6"  (1.676 m)   Wt 110 lb 12.8 oz (50.3 kg)   SpO2 98%   BMI 17.88 kg/m   Vitals:   04/22/23 1048  BP: 100/69  Pulse: 98  Height: 5\' 6"  (1.676 m)  Weight: 110 lb 12.8 oz (50.3 kg)  SpO2: 98%  BMI (Calculated): 17.89    Physical Exam Vitals and nursing note reviewed.  Constitutional:      Appearance: Normal appearance. She is normal weight.  HENT:     Head: Normocephalic and atraumatic.  Eyes:     Extraocular Movements: Extraocular movements intact.     Conjunctiva/sclera: Conjunctivae normal.     Pupils: Pupils are equal, round, and reactive to light.  Cardiovascular:  Rate and Rhythm: Normal rate.  Pulmonary:     Effort: Pulmonary effort is normal.  Musculoskeletal:        General: Normal range of motion.  Neurological:     General: No focal deficit present.     Mental Status: She is alert and oriented to person, place, and time. Mental status is at baseline.  Psychiatric:        Mood and Affect: Mood normal.        Behavior: Behavior normal.        Thought Content: Thought content normal.        Judgment: Judgment normal.      No results found for any visits on 04/22/23.  No results found for this or any previous visit (from the past 2160 hour(s)).     Assessment & Plan:   Problem List Items Addressed This Visit       Active Problems   Anxiety and depression    Patient stable.  Well controlled with current therapy.   Continue current meds.       Relevant Medications   clonazePAM (KLONOPIN) 1 MG tablet   AUVELITY 45-105 MG TBCR    Return in about 6 months (around 10/23/2023) for F/U.   Total time  spent: 20 minutes  Miki Kins, FNP  04/22/2023   This document may have been prepared by [facility] Voice Recognition software and as such may include unintentional dictation errors.

## 2023-04-25 ENCOUNTER — Encounter: Payer: Self-pay | Admitting: Family

## 2023-04-25 NOTE — Assessment & Plan Note (Signed)
Patient stable.  Well controlled with current therapy.   Continue current meds.  

## 2023-06-11 ENCOUNTER — Ambulatory Visit
Admission: EM | Admit: 2023-06-11 | Discharge: 2023-06-11 | Disposition: A | Discharge status: Home / Self Care | Payer: Commercial Managed Care - PPO

## 2023-06-11 DIAGNOSIS — L237 Allergic contact dermatitis due to plants, except food: Secondary | ICD-10-CM

## 2023-06-11 MED ORDER — PREDNISONE 10 MG (21) PO TBPK: ORAL_TABLET | Freq: Every day | ORAL | 0 refills | Status: DC

## 2023-06-11 MED ORDER — DEXAMETHASONE SODIUM PHOSPHATE 10 MG/ML IJ SOLN
10.0000 mg | Freq: Once | INTRAMUSCULAR | Status: AC
  Administered 2023-06-11: 10 mg via INTRAMUSCULAR

## 2023-06-11 NOTE — ED Provider Notes (Signed)
Renaldo Fiddler    CSN: [id number] Arrival date & time: 06/11/23  1054      History   Chief Complaint Chief Complaint  Patient presents with   Rash    HPI Victoria Brooks is a 27 y.o. female.   Patient presents for evaluation of erythematous pruritic rash around the mouth into the neck beginning yesterday evening after working in the yard.  Known exposure to poison ivy/oak.  Has not attempted treatment.  Initially had 1 spot on the cheek that she thought was a pimple.  Cleanse face with a retinol and now the skin feels tight.  Denies changes to the eyes.  Denies drainage or fever.  Past Medical History:  Diagnosis Date   Chicken pox     Patient Active Problem List   Diagnosis Date Noted   Family history of breast cancer 05/29/2021   Breast lump in female 05/29/2021   Scoliosis deformity of spine 09/23/2019   Anxiety and depression 07/23/2016    Past Surgical History:  Procedure Laterality Date   ADENOIDECTOMY     SHOULDER SURGERY Right    TONSILLECTOMY     TOOTH EXTRACTION     WISDOM TOOTH EXTRACTION      OB History     Gravida  1   Para      Term      Preterm      AB      Living         SAB      IAB      Ectopic      Multiple      Live Births               Home Medications    Prior to Admission medications   Medication Sig Start Date End Date Taking? Authorizing Provider  predniSONE (STERAPRED UNI-PAK 21 TAB) 10 MG (21) TBPK tablet Take by mouth daily. Take 6 tabs by mouth daily  for 1 days, then 5 tabs for 1 days, then 4 tabs for 1 days, then 3 tabs for 1 days, 2 tabs for 1 days, then 1 tab by mouth daily for 1 days 06/11/23  Yes Adelle Zachar R, NP  tretinoin (RETIN-A) 0.025 % cream Apply topically at bedtime.   Yes [provider]  AUVELITY 45-105 MG TBCR Take 1 tablet by mouth 2 (two) times daily. 04/22/23   Miki Kins, FNP  clonazePAM (KLONOPIN) 1 MG tablet Take 1 tablet (1 mg total) by mouth 3 (three) times  daily as needed for anxiety. 04/22/23   Miki Kins, FNP  ondansetron (ZOFRAN-ODT) 4 MG disintegrating tablet Take 1 tablet (4 mg total) by mouth every 6 (six) hours as needed for nausea or vomiting. 12/17/22   Ward, Layla Maw, DO  spironolactone (ALDACTONE) 100 MG tablet Take 100 mg by mouth daily.    [provider]    Family History Family History  Problem Relation Age of Onset   Heart disease Father    Hypertension Maternal Uncle    Cancer Maternal Grandmother 51       breast   Alzheimer's disease Maternal Grandfather     Social History Social History   Tobacco Use   Smoking status: Former    Current packs/day: 0.00    Types: Cigarettes    Start date: 2016    Quit date: 2019    Years since quitting: 5.6   Smokeless tobacco: Never  Vaping Use   Vaping status: Every  Day  Substance Use Topics   Alcohol use: Yes    Comment: occ   Drug use: Yes    Types: Marijuana     Allergies   Patient has no known allergies.   Review of Systems Review of Systems  Constitutional: Negative.   HENT: Negative.    Respiratory: Negative.    Cardiovascular: Negative.   Skin:  Positive for rash. Negative for color change, pallor and wound.     Physical Exam Triage Vital Signs ED Triage Vitals [06/11/23 1111]  Encounter Vitals Group     BP (!) 120/109     Systolic BP Percentile      Diastolic BP Percentile      Pulse Rate 68     Resp 16     Temp 100 F (37.8 C)     Temp Source Oral     SpO2 100 %     Weight      Height      Head Circumference      Peak Flow      Pain Score 0     Pain Loc      Pain Education      Exclude from Growth Chart    No data found.  Updated Vital Signs BP 111/76   Pulse 68   Temp 98.6 F (37 C)   Resp 16   LMP 06/08/2023   SpO2 100%   Visual Acuity Right Eye Distance:   Left Eye Distance:   Bilateral Distance:    Right Eye Near:   Left Eye Near:    Bilateral Near:     Physical Exam Constitutional:       Appearance: Normal appearance.  HENT:     Head: Normocephalic.  Eyes:     Extraocular Movements: Extraocular movements intact.  Pulmonary:     Effort: Pulmonary effort is normal.  Skin:    Comments: Erythematous blistering papular rash present to the bilateral cheeks, chin and the anterior of the neck  Neurological:     Mental Status: She is alert and oriented to person, place, and time. Mental status is at baseline.      UC Treatments / Results  Labs (all labs ordered are listed, but only abnormal results are displayed) Labs Reviewed - No data to display  EKG   Radiology No results found.  Procedures Procedures (including critical care time)  Medications Ordered in UC Medications  dexamethasone (DECADRON) injection 10 mg (10 mg Intramuscular Given 06/11/23 1149)    Initial Impression / Assessment and Plan / UC Course  I have reviewed the triage vital signs and the nursing notes.  Pertinent labs & imaging results that were available during my care of the patient were reviewed by me and considered in my medical decision making (see chart for details).  Poison ivy dermatitis  Rash consistent with above diagnosis, known exposure, no involvement of the eyes nor currently has any respiratory symptoms, stable for outpatient management, Decadron injection given in office and prescribed prednisone taper for outpatient use recommended over-the-counter antihistamines for management of pruritus, advised against long exposure to heat to prevent further irritation, given strict precautions that if symptoms persist or worsen to follow-up for reevaluation Final Clinical Impressions(s) / UC Diagnoses   Final diagnoses:  Poison ivy dermatitis     Discharge Instructions      Today you are being treated for the poison ivy/oak rash  You have been given an injection of steroids today in the office today  to help reduce the inflammatory process that occurs with this rash which will help  minimize your itching as well as begin to clear  Starting tomorrow take prednisone every morning with food as directed, to continue the above process  You may continue use of topical calamine or Benadryl cream to help manage itching, you may also continue oral Benadryl  Please avoid long exposures to heat such as a hot steamy shower or being outside as this may cause further irritation to your rash  You may follow-up with his urgent care as needed if symptoms persist or worsen    ED Prescriptions     Medication Sig Dispense Auth. Provider   predniSONE (STERAPRED UNI-PAK 21 TAB) 10 MG (21) TBPK tablet Take by mouth daily. Take 6 tabs by mouth daily  for 1 days, then 5 tabs for 1 days, then 4 tabs for 1 days, then 3 tabs for 1 days, 2 tabs for 1 days, then 1 tab by mouth daily for 1 days 21 tablet Kaven Cumbie, Elita Boone, NP      PDMP not reviewed this encounter.   Valinda Hoar, [place] 06/11/23 [phone]

## 2023-06-11 NOTE — ED Triage Notes (Signed)
Patient to Urgent Care with complaints of rash present to mouth/ chin/ neck after poison ivy exposure.   Symptoms started yesterday. Initially thought she had a pimple so had applied retinol.

## 2023-06-11 NOTE — Discharge Instructions (Addendum)
Today you are being treated for the poison ivy/oak rash  You have been given an injection of steroids today in the office today to help reduce the inflammatory process that occurs with this rash which will help minimize your itching as well as begin to clear  Starting tomorrow take prednisone every morning with food as directed, to continue the above process  You may continue use of topical calamine or Benadryl cream to help manage itching, you may also continue oral Benadryl  Please avoid long exposures to heat such as a hot steamy shower or being outside as this may cause further irritation to your rash  You may follow-up with his urgent care as needed if symptoms persist or worsen 

## 2023-08-15 ENCOUNTER — Other Ambulatory Visit: Payer: Self-pay | Admitting: Cardiology

## 2023-08-15 ENCOUNTER — Telehealth: Payer: Self-pay | Admitting: Family

## 2023-08-15 MED ORDER — ONDANSETRON 4 MG PO TBDP: 4.0000 mg | ORAL_TABLET | Freq: Four times a day (QID) | ORAL | 0 refills | Status: AC | PRN

## 2023-08-15 NOTE — Telephone Encounter (Signed)
Patient's mother left VM that they are in [place], [place] and the patient has a bad GI bug. States that the patient received an infusion on anti-nausea meds yesterday and is getting another round today but they are flying home tomorrow and she needs something sent in to help her nausea subside so they can make it home. She is requesting Zofran be sent in.  [facility] - the [place], [place] one I put in her chart.

## 2023-08-19 ENCOUNTER — Ambulatory Visit (INDEPENDENT_AMBULATORY_CARE_PROVIDER_SITE_OTHER): Payer: Commercial Managed Care - PPO | Admitting: Family

## 2023-08-19 VITALS — BP 100/65 | Ht 66.0 in | Wt 110.8 lb

## 2023-08-19 DIAGNOSIS — R1084 Generalized abdominal pain: Secondary | ICD-10-CM

## 2023-08-19 DIAGNOSIS — E860 Dehydration: Secondary | ICD-10-CM | POA: Diagnosis not present

## 2023-08-19 DIAGNOSIS — R112 Nausea with vomiting, unspecified: Secondary | ICD-10-CM

## 2023-08-19 DIAGNOSIS — Z0130 Encounter for examination of blood pressure without abnormal findings: Secondary | ICD-10-CM

## 2023-08-19 LAB — POCT URINALYSIS DIPSTICK
Bilirubin, UA: NEGATIVE
Blood, UA: NEGATIVE
Glucose, UA: NEGATIVE
Ketones, UA: NEGATIVE
Leukocytes, UA: NEGATIVE
Nitrite, UA: NEGATIVE
Protein, UA: POSITIVE — AB
Spec Grav, UA: 1.02 (ref 1.010–1.025)
Urobilinogen, UA: 0.2 U/dL
pH, UA: 6 (ref 5.0–8.0)

## 2023-08-19 NOTE — Progress Notes (Signed)
Acute Office Visit  Subjective:     Patient ID: Victoria Brooks, female    DOB: Aug 20, 1996, 27 y.o.   MRN: [id number]  Patient is in today for  Chief Complaint  Patient presents with   Follow-up    Stomach pains, vomiting, nausea, low grade fever    Randomly starts with Nausea then Vomiting that last 3-4 days.  She has been having this occur several times a year.  Has been getting iv's to help keep her from getting stuck in the hospital, but she's been having this repetitively.   Asks if we can refer her to GI given the recurrence.      Review of Systems  Constitutional:  Positive for malaise/fatigue and weight loss.  Gastrointestinal:  Positive for abdominal pain, nausea and vomiting.  All other systems reviewed and are negative.       Objective:    BP 100/65   Ht 5\' 6"  (1.676 m)   Wt 110 lb 12.8 oz (50.3 kg)   BMI 17.88 kg/m   Physical Exam Vitals and nursing note reviewed.  Constitutional:      Appearance: Normal appearance. She is normal weight.  HENT:     Head: Normocephalic.  Eyes:     Extraocular Movements: Extraocular movements intact.     Conjunctiva/sclera: Conjunctivae normal.     Pupils: Pupils are equal, round, and reactive to light.  Cardiovascular:     Rate and Rhythm: Normal rate.  Pulmonary:     Effort: Pulmonary effort is normal.  Neurological:     General: No focal deficit present.     Mental Status: She is alert and oriented to person, place, and time. Mental status is at baseline.  Psychiatric:        Mood and Affect: Mood normal.        Behavior: Behavior normal.        Thought Content: Thought content normal.     Results for orders placed or performed in visit on 08/19/23  Allergen food profile specific IgE [phone])  Result Value Ref Range   Class Description Allergens Comment    IgE (Immunoglobulin E), Serum 90 6 - 495 IU/mL   Egg White IgE <0.10 Class 0 kU/L   Milk IgE 0.74 (A) Class II kU/L   Codfish IgE <0.10 Class 0 kU/L    Wheat IgE <0.10 Class 0 kU/L   Allergen Corn, IgE <0.10 Class 0 kU/L   Peanut IgE <0.10 Class 0 kU/L   Soybean IgE <0.10 Class 0 kU/L   Shrimp IgE <0.10 Class 0 kU/L   Allergen Tomato, IgE <0.10 Class 0 kU/L   Orange <0.10 Class 0 kU/L   Tuna <0.10 Class 0 kU/L   Allergen Apple, IgE <0.10 Class 0 kU/L   Chicken IgE <0.10 Class 0 kU/L  Celiac Disease Ab Screen w/Rfx  Result Value Ref Range   Antigliadin Abs, IgA 4 0 - 19 units   Transglutaminase IgA <2 0 - 3 U/mL   IgA/Immunoglobulin A, Serum 181 87 - 352 mg/dL  Inflammatory Bowel Disease-IBD  Result Value Ref Range   Saccharomyces cerevisiae, IgG 25.9 (H) 0.0 - 24.9 Units   Saccharomyces cerevisiae, IgA <20.0 0.0 - 24.9 Units   Atypical pANCA <1:20 Neg:<1:20 titer  CBC with Diff  Result Value Ref Range   WBC 7.0 3.4 - 10.8 x10E3/uL   RBC 4.68 3.77 - 5.28 x10E6/uL   Hemoglobin 15.1 11.1 - 15.9 g/dL   Hematocrit 19.1 47.8 - 46.6 %  MCV 94 79 - 97 fL   MCH 32.3 26.6 - 33.0 pg   MCHC 34.2 31.5 - 35.7 g/dL   RDW 40.9 81.1 - 91.4 %   Platelets 251 150 - 450 x10E3/uL   Neutrophils 62 Not Estab. %   Lymphs 31 Not Estab. %   Monocytes 6 Not Estab. %   Eos 1 Not Estab. %   Basos 0 Not Estab. %   Neutrophils Absolute 4.2 1.4 - 7.0 x10E3/uL   Lymphocytes Absolute 2.2 0.7 - 3.1 x10E3/uL   Monocytes Absolute 0.4 0.1 - 0.9 x10E3/uL   EOS (ABSOLUTE) 0.1 0.0 - 0.4 x10E3/uL   Basophils Absolute 0.0 0.0 - 0.2 x10E3/uL   Immature Granulocytes 0 Not Estab. %   Immature Grans (Abs) 0.0 0.0 - 0.1 x10E3/uL  CMP14+EGFR  Result Value Ref Range   Glucose 75 70 - 99 mg/dL   BUN 9 6 - 20 mg/dL   Creatinine, Ser 7.82 0.57 - 1.00 mg/dL   eGFR 956 >21 HY/QMV/7.84   BUN/Creatinine Ratio 11 9 - 23   Sodium 139 134 - 144 mmol/L   Potassium 3.9 3.5 - 5.2 mmol/L   Chloride 101 96 - 106 mmol/L   CO2 24 20 - 29 mmol/L   Calcium 9.4 8.7 - 10.2 mg/dL   Total Protein 7.0 6.0 - 8.5 g/dL   Albumin 4.5 4.0 - 5.0 g/dL   Globulin, Total 2.5 1.5 - 4.5  g/dL   Bilirubin Total 0.7 0.0 - 1.2 mg/dL   Alkaline Phosphatase 34 (L) 44 - 121 IU/L   AST 15 0 - 40 IU/L   ALT 13 0 - 32 IU/L  FSH+Prog+E2+SHBG  Result Value Ref Range   FSH 5.9 mIU/mL   Progesterone 0.3 ng/mL   Sex Hormone Binding 72.4 24.6 - 122.0 nmol/L   Estradiol <5.0 pg/mL  POCT Urinalysis Dipstick  Result Value Ref Range   Color, UA     Clarity, UA     Glucose, UA Negative Negative   Bilirubin, UA Negative    Ketones, UA Negative    Spec Grav, UA 1.020 1.010 - 1.025   Blood, UA Negative    pH, UA 6.0 5.0 - 8.0   Protein, UA Positive (A) Negative   Urobilinogen, UA 0.2 0.2 or 1.0 E.U./dL   Nitrite, UA Negative    Leukocytes, UA Negative Negative   Appearance     Odor      Recent Results (from the past 2160 hour(s))  POCT Urinalysis Dipstick     Status: Abnormal   Collection Time: 08/19/23 10:09 AM  Result Value Ref Range   Color, UA     Clarity, UA     Glucose, UA Negative Negative   Bilirubin, UA Negative    Ketones, UA Negative    Spec Grav, UA 1.020 1.010 - 1.025   Blood, UA Negative    pH, UA 6.0 5.0 - 8.0   Protein, UA Positive (A) Negative   Urobilinogen, UA 0.2 0.2 or 1.0 E.U./dL   Nitrite, UA Negative    Leukocytes, UA Negative Negative   Appearance     Odor    Allergen food profile specific IgE (69629)     Status: Abnormal   Collection Time: 08/19/23 10:12 AM  Result Value Ref Range   Class Description Allergens Comment     Comment:     Levels of Specific IgE       Class  Description of Class     ---------------------------  -----  --------------------                    <  0.10         0         Negative            0.10 -    0.31         0/I       Equivocal/Low            0.32 -    0.55         I         Low            0.56 -    1.40         II        Moderate            1.41 -    3.90         III       High            3.91 -   19.00         IV        Very High           19.01 -  100.00         V         Very High                   >100.00          VI        Very High    IgE (Immunoglobulin E), Serum 90 6 - 495 IU/mL   Egg White IgE <0.10 Class 0 kU/L   Milk IgE 0.74 (A) Class II kU/L   Codfish IgE <0.10 Class 0 kU/L   Wheat IgE <0.10 Class 0 kU/L   Allergen Corn, IgE <0.10 Class 0 kU/L   Peanut IgE <0.10 Class 0 kU/L   Soybean IgE <0.10 Class 0 kU/L   Shrimp IgE <0.10 Class 0 kU/L   Allergen Tomato, IgE <0.10 Class 0 kU/L   Orange <0.10 Class 0 kU/L   Tuna <0.10 Class 0 kU/L   Allergen Apple, IgE <0.10 Class 0 kU/L   Chicken IgE <0.10 Class 0 kU/L  Celiac Disease Ab Screen w/Rfx     Status: None   Collection Time: 08/19/23 10:12 AM  Result Value Ref Range   Antigliadin Abs, IgA 4 0 - 19 units    Comment:                    Negative                   0 - 19                    Weak Positive             20 - 30                    Moderate to Strong Positive   >30    Transglutaminase IgA <2 0 - 3 U/mL    Comment:                               Negative        0 -  3  Weak Positive   4 - 10                               Positive           >10  Tissue Transglutaminase (tTG) has been identified  as the endomysial antigen.  Studies have demonstr-  ated that endomysial IgA antibodies have over 99%  specificity for gluten sensitive enteropathy.    IgA/Immunoglobulin A, Serum 181 87 - 352 mg/dL  Inflammatory Bowel Disease-IBD     Status: Abnormal   Collection Time: 08/19/23 10:12 AM  Result Value Ref Range   Saccharomyces cerevisiae, IgG 25.9 (H) 0.0 - 24.9 Units    Comment:                                 Negative         <20.0                                 Equivocal  20.1 - 24.9                                 Positive     >or= 25.0    Saccharomyces cerevisiae, IgA <20.0 0.0 - 24.9 Units    Comment:                                  Negative        <20.0                                  Equivocal 20.1 - 24.9                                  Positive    >or= 25.0 IgA and IgG antibody  testing for S. cerevisiae is useful adjunct testing for differentiating Crohn's disease and ulcerative colitis. Close to 80% of Crohn's disease patients are positive for either IgA or IgG. In ulcerative colitis, less than 15% are positive for IgG and less than 2% are positive for IgA. Fewer than 5% are positive for either IgG or IgA antibody, and no healthy controls had antibody for both.    Atypical pANCA <1:20 Neg:<1:20 titer    Comment: The atypical pANCA pattern has been observed in a significant percentage of patients with ulcerative colitis, primary sclerosing cholangitis and autoimmune hepatitis.           ASCA+/PANCA- Suggestive of Crohn's disease           ASCA-/PANCA+ Suggestive of Ulcerative colitis   CBC with Diff     Status: None   Collection Time: 08/19/23 10:12 AM  Result Value Ref Range   WBC 7.0 3.4 - 10.8 x10E3/uL   RBC 4.68 3.77 - 5.28 x10E6/uL   Hemoglobin 15.1 11.1 - 15.9 g/dL   Hematocrit 91.7 91.5 - 46.6 %   MCV 94 79 - 97 fL   MCH 32.3 26.6 - 33.0 pg   MCHC 34.2 31.5 - 35.7 g/dL   RDW 05.6 97.9 - 48.0 %  Platelets 251 150 - 450 x10E3/uL   Neutrophils 62 Not Estab. %   Lymphs 31 Not Estab. %   Monocytes 6 Not Estab. %   Eos 1 Not Estab. %   Basos 0 Not Estab. %   Neutrophils Absolute 4.2 1.4 - 7.0 x10E3/uL   Lymphocytes Absolute 2.2 0.7 - 3.1 x10E3/uL   Monocytes Absolute 0.4 0.1 - 0.9 x10E3/uL   EOS (ABSOLUTE) 0.1 0.0 - 0.4 x10E3/uL   Basophils Absolute 0.0 0.0 - 0.2 x10E3/uL   Immature Granulocytes 0 Not Estab. %   Immature Grans (Abs) 0.0 0.0 - 0.1 x10E3/uL  CMP14+EGFR     Status: Abnormal   Collection Time: 08/19/23 10:12 AM  Result Value Ref Range   Glucose 75 70 - 99 mg/dL   BUN 9 6 - 20 mg/dL   Creatinine, Ser 1.61 0.57 - 1.00 mg/dL   eGFR 096 >04 VW/UJW/1.19   BUN/Creatinine Ratio 11 9 - 23   Sodium 139 134 - 144 mmol/L   Potassium 3.9 3.5 - 5.2 mmol/L   Chloride 101 96 - 106 mmol/L   CO2 24 20 - 29 mmol/L   Calcium 9.4 8.7 - 10.2  mg/dL   Total Protein 7.0 6.0 - 8.5 g/dL   Albumin 4.5 4.0 - 5.0 g/dL   Globulin, Total 2.5 1.5 - 4.5 g/dL   Bilirubin Total 0.7 0.0 - 1.2 mg/dL   Alkaline Phosphatase 34 (L) 44 - 121 IU/L   AST 15 0 - 40 IU/L   ALT 13 0 - 32 IU/L  FSH+Prog+E2+SHBG     Status: None   Collection Time: 08/19/23 10:12 AM  Result Value Ref Range   FSH 5.9 mIU/mL    Comment:                      Adult Female             Range                       Follicular phase      3.5 -  12.5                       Ovulation phase       4.7 -  21.5                       Luteal phase          1.7 -   7.7                       Postmenopausal       25.8 - 134.8    Progesterone 0.3 ng/mL    Comment:                      Follicular phase       0.1 -   0.9                      Luteal phase           1.8 -  23.9                      Ovulation phase        0.1 -  12.0  Pregnant                         First trimester    11.0 -  44.3                         Second trimester   25.4 -  83.3                         Third trimester    58.7 - 214.0                      Postmenopausal         0.0 -   0.1    Sex Hormone Binding 72.4 24.6 - 122.0 nmol/L   Estradiol <5.0 pg/mL    Comment:                      Adult Female             Range                       Follicular phase     12.5 - 166.0                       Ovulation phase      85.8 - 498.0                       Luteal phase         43.8 - 211.0                       Postmenopausal       <6.0 -  54.7                      Pregnancy                       1st trimester     215.0 - >4300.0 [employer] ECLIA methodology        Assessment & Plan:   Problem List Items Addressed This Visit   None Visit Diagnoses     Nausea and vomiting, unspecified vomiting type    -  Primary   Relevant Orders   Allergen food profile specific IgE (16109) (Completed)   Celiac Disease Ab Screen w/Rfx (Completed)   Inflammatory Bowel Disease-IBD (Completed)   CBC with  Diff (Completed)   CMP14+EGFR (Completed)   FSH+Prog+E2+SHBG (Completed)   POCT Urinalysis Dipstick (Completed)   Ambulatory referral to Gastroenterology   Generalized abdominal pain       Relevant Orders   Allergen food profile specific IgE (60454) (Completed)   Celiac Disease Ab Screen w/Rfx (Completed)   Inflammatory Bowel Disease-IBD (Completed)   CBC with Diff (Completed)   CMP14+EGFR (Completed)   FSH+Prog+E2+SHBG (Completed)   POCT Urinalysis Dipstick (Completed)   Ambulatory referral to Gastroenterology   Dehydration       Relevant Orders   POCT Urinalysis Dipstick (Completed)   Ambulatory referral to Gastroenterology      Will set up referral to GI.  Also checking some blood work for celiac, IBD, and allergen profile.    Return as previously scheduled.  Total time spent: 20 minutes  Miki Kins, FNP  08/19/2023   This  document may have been prepared by [employer] Voice Recognition software and as such may include unintentional dictation errors.

## 2023-08-21 LAB — CBC WITH DIFFERENTIAL/PLATELET
Basophils Absolute: 0 10*3/uL (ref 0.0–0.2)
Basos: 0 %
EOS (ABSOLUTE): 0.1 10*3/uL (ref 0.0–0.4)
Eos: 1 %
Hematocrit: 44.2 % (ref 34.0–46.6)
Hemoglobin: 15.1 g/dL (ref 11.1–15.9)
Immature Grans (Abs): 0 10*3/uL (ref 0.0–0.1)
Immature Granulocytes: 0 %
Lymphocytes Absolute: 2.2 10*3/uL (ref 0.7–3.1)
Lymphs: 31 %
MCH: 32.3 pg (ref 26.6–33.0)
MCHC: 34.2 g/dL (ref 31.5–35.7)
MCV: 94 fL (ref 79–97)
Monocytes Absolute: 0.4 10*3/uL (ref 0.1–0.9)
Monocytes: 6 %
Neutrophils Absolute: 4.2 10*3/uL (ref 1.4–7.0)
Neutrophils: 62 %
Platelets: 251 10*3/uL (ref 150–450)
RBC: 4.68 x10E6/uL (ref 3.77–5.28)
RDW: 13 % (ref 11.7–15.4)
WBC: 7 10*3/uL (ref 3.4–10.8)

## 2023-08-21 LAB — CMP14+EGFR
ALT: 13 [IU]/L (ref 0–32)
AST: 15 [IU]/L (ref 0–40)
Albumin: 4.5 g/dL (ref 4.0–5.0)
Alkaline Phosphatase: 34 [IU]/L — ABNORMAL LOW (ref 44–121)
BUN/Creatinine Ratio: 11 (ref 9–23)
BUN: 9 mg/dL (ref 6–20)
Bilirubin Total: 0.7 mg/dL (ref 0.0–1.2)
CO2: 24 mmol/L (ref 20–29)
Calcium: 9.4 mg/dL (ref 8.7–10.2)
Chloride: 101 mmol/L (ref 96–106)
Creatinine, Ser: 0.83 mg/dL (ref 0.57–1.00)
Globulin, Total: 2.5 g/dL (ref 1.5–4.5)
Glucose: 75 mg/dL (ref 70–99)
Potassium: 3.9 mmol/L (ref 3.5–5.2)
Sodium: 139 mmol/L (ref 134–144)
Total Protein: 7 g/dL (ref 6.0–8.5)
eGFR: 100 mL/min/{1.73_m2} (ref >59)

## 2023-08-21 LAB — ALLERGEN FOOD PROFILE SPECIFIC IGE
Allergen Apple, IgE: <0.1 kU/L
Allergen Corn, IgE: <0.1 kU/L
Allergen Tomato, IgE: <0.1 kU/L
Chicken IgE: <0.1 kU/L
Codfish IgE: <0.1 kU/L
Egg White IgE: <0.1 kU/L
IgE (Immunoglobulin E), Serum: 90 [IU]/mL (ref 6–495)
Milk IgE: 0.74 kU/L — AB
Orange: <0.1 kU/L
Peanut IgE: <0.1 kU/L
Shrimp IgE: <0.1 kU/L
Soybean IgE: <0.1 kU/L
Tuna: <0.1 kU/L
Wheat IgE: <0.1 kU/L

## 2023-08-21 LAB — INFLAMMATORY BOWEL DISEASE-IBD
Atypical pANCA: <1:20 {titer}
Saccharomyces cerevisiae, IgA: <20 U (ref 0.0–24.9)
Saccharomyces cerevisiae, IgG: 25.9 U — ABNORMAL HIGH (ref 0.0–24.9)

## 2023-08-21 LAB — FSH+PROG+E2+SHBG
Estradiol: <5 pg/mL
FSH: 5.9 m[IU]/mL
Progesterone: 0.3 ng/mL
Sex Hormone Binding: 72.4 nmol/L (ref 24.6–122.0)

## 2023-08-21 LAB — CELIAC DISEASE AB SCREEN W/RFX
Antigliadin Abs, IgA: 4 U (ref 0–19)
IgA/Immunoglobulin A, Serum: 181 mg/dL (ref 87–352)
Transglutaminase IgA: <2 U/mL (ref 0–3)

## 2023-08-26 ENCOUNTER — Telehealth: Payer: Self-pay | Admitting: Family

## 2023-08-26 NOTE — Telephone Encounter (Signed)
Patient's mother left VM that she has gotten a list of GI doctors that are in-network with the patient's new insurance. We just need to call and see where she wants the referral sent to.

## 2023-08-28 ENCOUNTER — Telehealth: Payer: Self-pay | Admitting: Family

## 2023-08-28 NOTE — Telephone Encounter (Signed)
Patient's mother called in to check on this referral. She wants a GI referral to [place] at [facility]. Please advise.

## 2023-09-01 ENCOUNTER — Encounter: Payer: Self-pay | Admitting: Family

## 2023-09-07 ENCOUNTER — Other Ambulatory Visit: Payer: Self-pay | Admitting: Family

## 2023-09-07 DIAGNOSIS — F32A Depression, unspecified: Secondary | ICD-10-CM

## 2023-09-07 DIAGNOSIS — F419 Anxiety disorder, unspecified: Secondary | ICD-10-CM

## 2023-10-24 NOTE — Telephone Encounter (Signed)
 Entered in error

## 2023-10-31 ENCOUNTER — Encounter: Payer: Self-pay | Admitting: Family

## 2023-10-31 ENCOUNTER — Ambulatory Visit (INDEPENDENT_AMBULATORY_CARE_PROVIDER_SITE_OTHER): Payer: No Typology Code available for payment source | Admitting: Family

## 2023-10-31 DIAGNOSIS — F32A Depression, unspecified: Secondary | ICD-10-CM

## 2023-10-31 DIAGNOSIS — Z0130 Encounter for examination of blood pressure without abnormal findings: Secondary | ICD-10-CM

## 2023-10-31 DIAGNOSIS — F419 Anxiety disorder, unspecified: Secondary | ICD-10-CM

## 2023-10-31 MED ORDER — AUVELITY 45-105 MG PO TBCR: 1.0000 | EXTENDED_RELEASE_TABLET | Freq: Two times a day (BID) | ORAL | 5 refills | Status: DC

## 2023-10-31 NOTE — Progress Notes (Signed)
 Established Patient Office Visit  Subjective:  Patient ID: Victoria Brooks, female    DOB: Sep 17, 1996  Age: 28 y.o. MRN: [id number]  Chief Complaint  Patient presents with   Follow-up    6 month follow up    Patient is here today for her 6 months follow up.  She has been feeling since last appointment.   She does have additional concerns to discuss today.  She does have her appointment at [facility], says they told her that the hospital is not in network for her insurance, and that they do most of their procedures there.   Her concern is that she needs her auvelity , which she has not been able to get.  She is clearly anxious in office today.   Labs are not due today. She needs refills.   I have reviewed her active problem list, medication list, allergies, notes from last encounter, lab results for her appointment today.      No other concerns at this time.   Past Medical History:  Diagnosis Date   Chicken pox     Past Surgical History:  Procedure Laterality Date   ADENOIDECTOMY     SHOULDER SURGERY Right    TONSILLECTOMY     TOOTH EXTRACTION     WISDOM TOOTH EXTRACTION      Social History   Socioeconomic History   Marital status: Single    Spouse name: Not on file   Number of children: Not on file   Years of education: Not on file   Highest education level: Not on file  Occupational History   Not on file  Tobacco Use   Smoking status: Former    Current packs/day: 0.00    Types: Cigarettes    Start date: 2016    Quit date: 2019    Years since quitting: 6.0   Smokeless tobacco: Never  Vaping Use   Vaping status: Every Day  Substance and Sexual Activity   Alcohol use: Yes    Comment: occ   Drug use: Yes    Types: Marijuana   Sexual activity: Yes    Birth control/protection: Condom    Comment: states just got RX for [facility]  Other Topics Concern   Not on file  Social History Narrative   Parents Divorced   Mom is custodial parent but, dad  remains very involved   Brother 1 year older   Mom works for orthoptist   Social Drivers of Corporate Investment Banker Strain: Not on file  Food Insecurity: Not on file  Transportation Needs: Not on file  Physical Activity: Not on file  Stress: Not on file  Social Connections: Not on file  Intimate Partner Violence: Not on file    Family History  Problem Relation Age of Onset   Heart disease Father    Hypertension Maternal Uncle    Cancer Maternal Grandmother 52       breast   Alzheimer's disease Maternal Grandfather     No Known Allergies  Review of Systems  Psychiatric/Behavioral:  Positive for depression. The patient is nervous/anxious.   All other systems reviewed and are negative.      Objective:   BP 96/68   Pulse (!) 102   Ht 5' 6 (1.676 m)   Wt 109 lb 9.6 oz (49.7 kg)   SpO2 100%   BMI 17.69 kg/m   Vitals:   10/31/23 0948  BP: 96/68  Pulse: (!) 102  Height: 5' 6 (  1.676 m)  Weight: 109 lb 9.6 oz (49.7 kg)  SpO2: 100%  BMI (Calculated): 17.7    Physical Exam Vitals and nursing note reviewed.  Constitutional:      Appearance: Normal appearance. She is normal weight.  HENT:     Head: Normocephalic.  Eyes:     Extraocular Movements: Extraocular movements intact.     Conjunctiva/sclera: Conjunctivae normal.     Pupils: Pupils are equal, round, and reactive to light.  Cardiovascular:     Rate and Rhythm: Normal rate.  Pulmonary:     Effort: Pulmonary effort is normal.  Neurological:     General: No focal deficit present.     Mental Status: She is alert and oriented to person, place, and time. Mental status is at baseline.  Psychiatric:        Attention and Perception: Attention and perception normal.        Mood and Affect: Affect normal. Mood is anxious.        Behavior: Behavior normal. Behavior is cooperative.        Thought Content: Thought content normal.        Judgment: Judgment normal.      No results  found for any visits on 10/31/23.  Recent Results (from the past 2160 hours)  POCT Urinalysis Dipstick     Status: Abnormal   Collection Time: 08/19/23 10:09 AM  Result Value Ref Range   Color, UA     Clarity, UA     Glucose, UA Negative Negative   Bilirubin, UA Negative    Ketones, UA Negative    Spec Grav, UA 1.020 1.010 - 1.025   Blood, UA Negative    pH, UA 6.0 5.0 - 8.0   Protein, UA Positive (A) Negative   Urobilinogen, UA 0.2 0.2 or 1.0 E.U./dL   Nitrite, UA Negative    Leukocytes, UA Negative Negative   Appearance     Odor    Allergen food profile specific IgE (13996)     Status: Abnormal   Collection Time: 08/19/23 10:12 AM  Result Value Ref Range   Class Description Allergens Comment     Comment:     Levels of Specific IgE       Class  Description of Class     ---------------------------  -----  --------------------                    < 0.10         0         Negative            0.10 -    0.31         0/I       Equivocal/Low            0.32 -    0.55         I         Low            0.56 -    1.40         II        Moderate            1.41 -    3.90         III       High            3.91 -   19.00         IV  Very High           19.01 -  100.00         V         Very High                   >100.00         VI        Very High    IgE (Immunoglobulin E), Serum 90 6 - 495 IU/mL   Egg White IgE <0.10 Class 0 kU/L   Milk IgE 0.74 (A) Class II kU/L   Codfish IgE <0.10 Class 0 kU/L   Wheat IgE <0.10 Class 0 kU/L   Allergen Corn, IgE <0.10 Class 0 kU/L   Peanut IgE <0.10 Class 0 kU/L   Soybean IgE <0.10 Class 0 kU/L   Shrimp IgE <0.10 Class 0 kU/L   Allergen Tomato, IgE <0.10 Class 0 kU/L   Orange <0.10 Class 0 kU/L   Tuna <0.10 Class 0 kU/L   Allergen Apple, IgE <0.10 Class 0 kU/L   Chicken IgE <0.10 Class 0 kU/L  Celiac Disease Ab Screen w/Rfx     Status: None   Collection Time: 08/19/23 10:12 AM  Result Value Ref Range   Antigliadin Abs, IgA 4 0 - 19  units    Comment:                    Negative                   0 - 19                    Weak Positive             20 - 30                    Moderate to Strong Positive   >30    Transglutaminase IgA <2 0 - 3 U/mL    Comment:                               Negative        0 -  3                               Weak Positive   4 - 10                               Positive           >10  Tissue Transglutaminase (tTG) has been identified  as the endomysial antigen.  Studies have demonstr-  ated that endomysial IgA antibodies have over 99%  specificity for gluten sensitive enteropathy.    IgA/Immunoglobulin A, Serum 181 87 - 352 mg/dL  Inflammatory Bowel Disease-IBD     Status: Abnormal   Collection Time: 08/19/23 10:12 AM  Result Value Ref Range   Saccharomyces cerevisiae, IgG 25.9 (H) 0.0 - 24.9 Units    Comment:                                 Negative         <20.0  Equivocal  20.1 - 24.9                                 Positive     >or= 25.0    Saccharomyces cerevisiae, IgA <20.0 0.0 - 24.9 Units    Comment:                                  Negative        <20.0                                  Equivocal 20.1 - 24.9                                  Positive    >or= 25.0 IgA and IgG antibody testing for S. cerevisiae is useful adjunct testing for differentiating Crohn's disease and ulcerative colitis. Close to 80% of Crohn's disease patients are positive for either IgA or IgG. In ulcerative colitis, less than 15% are positive for IgG and less than 2% are positive for IgA. Fewer than 5% are positive for either IgG or IgA antibody, and no healthy controls had antibody for both.    Atypical pANCA <1:20 Neg:<1:20 titer    Comment: The atypical pANCA pattern has been observed in a significant percentage of patients with ulcerative colitis, primary sclerosing cholangitis and autoimmune hepatitis.           ASCA+/PANCA- Suggestive of Crohn's disease            ASCA-/PANCA+ Suggestive of Ulcerative colitis   CBC with Diff     Status: None   Collection Time: 08/19/23 10:12 AM  Result Value Ref Range   WBC 7.0 3.4 - 10.8 x10E3/uL   RBC 4.68 3.77 - 5.28 x10E6/uL   Hemoglobin 15.1 11.1 - 15.9 g/dL   Hematocrit 55.7 65.9 - 46.6 %   MCV 94 79 - 97 fL   MCH 32.3 26.6 - 33.0 pg   MCHC 34.2 31.5 - 35.7 g/dL   RDW 86.9 88.2 - 84.5 %   Platelets 251 150 - 450 x10E3/uL   Neutrophils 62 Not Estab. %   Lymphs 31 Not Estab. %   Monocytes 6 Not Estab. %   Eos 1 Not Estab. %   Basos 0 Not Estab. %   Neutrophils Absolute 4.2 1.4 - 7.0 x10E3/uL   Lymphocytes Absolute 2.2 0.7 - 3.1 x10E3/uL   Monocytes Absolute 0.4 0.1 - 0.9 x10E3/uL   EOS (ABSOLUTE) 0.1 0.0 - 0.4 x10E3/uL   Basophils Absolute 0.0 0.0 - 0.2 x10E3/uL   Immature Granulocytes 0 Not Estab. %   Immature Grans (Abs) 0.0 0.0 - 0.1 x10E3/uL  CMP14+EGFR     Status: Abnormal   Collection Time: 08/19/23 10:12 AM  Result Value Ref Range   Glucose 75 70 - 99 mg/dL   BUN 9 6 - 20 mg/dL   Creatinine, Ser 9.16 0.57 - 1.00 mg/dL   eGFR 899 >40 fO/fpw/8.26   BUN/Creatinine Ratio 11 9 - 23   Sodium 139 134 - 144 mmol/L   Potassium 3.9 3.5 - 5.2 mmol/L   Chloride 101 96 - 106 mmol/L   CO2 24 20 - 29 mmol/L   Calcium 9.4  8.7 - 10.2 mg/dL   Total Protein 7.0 6.0 - 8.5 g/dL   Albumin 4.5 4.0 - 5.0 g/dL   Globulin, Total 2.5 1.5 - 4.5 g/dL   Bilirubin Total 0.7 0.0 - 1.2 mg/dL   Alkaline Phosphatase 34 (L) 44 - 121 IU/L   AST 15 0 - 40 IU/L   ALT 13 0 - 32 IU/L  FSH+Prog+E2+SHBG     Status: None   Collection Time: 08/19/23 10:12 AM  Result Value Ref Range   FSH 5.9 mIU/mL    Comment:                      Adult Female             Range                       Follicular phase      3.5 -  12.5                       Ovulation phase       4.7 -  21.5                       Luteal phase          1.7 -   7.7                       Postmenopausal       25.8 - 134.8    Progesterone 0.3 ng/mL     Comment:                      Follicular phase       0.1 -   0.9                      Luteal phase           1.8 -  23.9                      Ovulation phase        0.1 -  12.0                      Pregnant                         First trimester    11.0 -  44.3                         Second trimester   25.4 -  83.3                         Third trimester    58.7 - 214.0                      Postmenopausal         0.0 -   0.1    Sex Hormone Binding 72.4 24.6 - 122.0 nmol/L   Estradiol <5.0 pg/mL    Comment:                      Adult Female             Range  Follicular phase     12.5 - 166.0                       Ovulation phase      85.8 - 498.0                       Luteal phase         43.8 - 211.0                       Postmenopausal       <6.0 -  54.7                      Pregnancy                       1st trimester     215.0 - >4300.0 Roche ECLIA methodology        Assessment & Plan:   Problem List Items Addressed This Visit       Other   Anxiety and depression   Patient has been stable on the auvelity , so will work to get this approved for her.  She has also been given another copay card to help cover going forward.   Will make sure we notify her about the prior authorization.       Relevant Medications   AUVELITY  45-105 MG TBCR   I will also find out if [facility] is in network for patient, so she will know if she needs to change insurance or not.   Return in about 6 months (around 04/29/2024).   Total time spent: 20 minutes  ALAN CHRISTELLA ARRANT, FNP  10/31/2023   This document may have been prepared by [facility] Voice Recognition software and as such may include unintentional dictation errors.

## 2023-11-01 NOTE — Assessment & Plan Note (Signed)
 Patient has been stable on the [place], so will work to get this approved for her.  She has also been given another copay card to help cover going forward.   Will make sure we notify her about the prior authorization.

## 2023-11-09 ENCOUNTER — Encounter: Payer: Self-pay | Admitting: Family

## 2024-01-19 ENCOUNTER — Other Ambulatory Visit: Payer: Self-pay | Admitting: Family

## 2024-01-19 DIAGNOSIS — F32A Depression, unspecified: Secondary | ICD-10-CM

## 2024-02-11 ENCOUNTER — Ambulatory Visit: Payer: Self-pay

## 2024-02-11 DIAGNOSIS — K2960 Other gastritis without bleeding: Secondary | ICD-10-CM | POA: Diagnosis present

## 2024-02-19 ENCOUNTER — Ambulatory Visit: Admit: 2024-02-19 | Payer: Self-pay | Admitting: Gastroenterology

## 2024-02-19 SURGERY — EGD (ESOPHAGOGASTRODUODENOSCOPY)
Anesthesia: General

## 2024-04-29 ENCOUNTER — Ambulatory Visit (INDEPENDENT_AMBULATORY_CARE_PROVIDER_SITE_OTHER): Payer: No Typology Code available for payment source | Admitting: Family

## 2024-04-29 ENCOUNTER — Encounter: Payer: Self-pay | Admitting: Family

## 2024-04-29 VITALS — BP 112/66 | HR 77 | Ht 66.0 in | Wt 113.0 lb

## 2024-04-29 DIAGNOSIS — F419 Anxiety disorder, unspecified: Secondary | ICD-10-CM

## 2024-04-29 DIAGNOSIS — F32A Depression, unspecified: Secondary | ICD-10-CM

## 2024-04-29 DIAGNOSIS — Z0130 Encounter for examination of blood pressure without abnormal findings: Secondary | ICD-10-CM

## 2024-04-29 NOTE — Progress Notes (Signed)
 Established Patient Office Visit  Subjective:  Patient ID: Victoria Brooks, female    DOB: 1995-11-30  Age: 28 y.o. MRN: [id number]  Chief Complaint  Patient presents with   Follow-up    6 month follow up    Patient is here today for her 3 months follow up.  She has been feeling fairly well since last appointment.   She does not have additional concerns to discuss today.  Labs are not due today.  She needs refills.   I have reviewed her active problem list, medication list, allergies, notes from last encounter, lab results for her appointment today.      No other concerns at this time.   Past Medical History:  Diagnosis Date   Chicken pox     Past Surgical History:  Procedure Laterality Date   ADENOIDECTOMY     SHOULDER SURGERY Right    TONSILLECTOMY     TOOTH EXTRACTION     WISDOM TOOTH EXTRACTION      Social History   Socioeconomic History   Marital status: Single    Spouse name: Not on file   Number of children: Not on file   Years of education: Not on file   Highest education level: Not on file  Occupational History   Not on file  Tobacco Use   Smoking status: Former    Current packs/day: 0.00    Types: Cigarettes    Start date: 2016    Quit date: 2019    Years since quitting: 6.5   Smokeless tobacco: Never  Vaping Use   Vaping status: Every Day  Substance and Sexual Activity   Alcohol use: Yes    Comment: occ   Drug use: Yes    Types: Marijuana   Sexual activity: Yes    Birth control/protection: Condom    Comment: states just got RX for [facility]  Other Topics Concern   Not on file  Social History Narrative   Parents Divorced   Mom is custodial parent but, dad remains very involved   Brother 1 year older   Mom works for Orthoptist   Social Drivers of Health   Financial Resource Strain: High Risk (12/31/2023)   Received from [facility]   Overall Financial Resource Strain (CARDIA)    Difficulty of  Paying Living Expenses: Very hard  Food Insecurity: Food Insecurity Present (12/31/2023)   Received from [facility]   Hunger Vital Sign    Worried About Running Out of Food in the Last Year: Not on file    Within the past 12 months, the food you bought just didn't last and you didn't have money to get more.: Often true  Transportation Needs: No Transportation Needs (12/31/2023)   Received from [facility] - Transportation    In the past 12 months, has lack of transportation kept you from medical appointments or from getting medications?: No    Lack of Transportation (Non-Medical): No  Physical Activity: Not on file  Stress: Not on file  Social Connections: Not on file  Intimate Partner Violence: Not on file    Family History  Problem Relation Age of Onset   Heart disease Father    Hypertension Maternal Uncle    Cancer Maternal Grandmother 60       breast   Alzheimer's disease Maternal Grandfather     No Known Allergies  Review of Systems  All other systems reviewed and are negative.  Objective:   BP 112/66 (BP Location: Left Arm, Patient Position: Supine, Cuff Size: Normal)   Pulse 77   Ht 5' 6 (1.676 m)   Wt 113 lb (51.3 kg)   SpO2 98%   BMI 18.24 kg/m   Vitals:   04/29/24 1006 04/29/24 1027 04/29/24 1030 04/29/24 1031  BP:  112/64 110/66 112/66  Pulse:  90 (!) 102 77  Height: 5' 6 (1.676 m)     Weight: 113 lb (51.3 kg)     SpO2: 98%     BMI (Calculated): 18.25       Physical Exam Vitals and nursing note reviewed.  Constitutional:      Appearance: Normal appearance. She is normal weight.  HENT:     Head: Normocephalic.  Eyes:     Extraocular Movements: Extraocular movements intact.     Conjunctiva/sclera: Conjunctivae normal.     Pupils: Pupils are equal, round, and reactive to light.  Cardiovascular:     Rate and Rhythm: Normal rate.  Pulmonary:     Effort: Pulmonary effort is normal.   Neurological:     General: No focal deficit present.     Mental Status: She is alert and oriented to person, place, and time. Mental status is at baseline.  Psychiatric:        Mood and Affect: Mood normal.        Behavior: Behavior normal.        Thought Content: Thought content normal.      No results found for any visits on 04/29/24.  No results found for this or any previous visit (from the past 2160 hours).     Assessment & Plan Anxiety and depression Patient stable.  Well controlled with current therapy.   Continue current meds.      Return in about 6 months (around 10/30/2024).   Total time spent: 20 minutes  ALAN CHRISTELLA ARRANT, FNP  04/29/2024   This document may have been prepared by [facility] Voice Recognition software and as such may include unintentional dictation errors.

## 2024-05-14 ENCOUNTER — Other Ambulatory Visit: Payer: Self-pay | Admitting: Family

## 2024-05-14 DIAGNOSIS — F32A Depression, unspecified: Secondary | ICD-10-CM

## 2024-05-23 ENCOUNTER — Encounter: Payer: Self-pay | Admitting: Family

## 2024-05-23 NOTE — Assessment & Plan Note (Signed)
 Patient stable.  Well controlled with current therapy.   Continue current meds.

## 2024-07-23 ENCOUNTER — Other Ambulatory Visit: Payer: Self-pay | Admitting: Family

## 2024-07-23 DIAGNOSIS — F32A Depression, unspecified: Secondary | ICD-10-CM

## 2024-07-27 ENCOUNTER — Encounter: Payer: Self-pay | Admitting: Family

## 2024-07-27 ENCOUNTER — Other Ambulatory Visit: Payer: Self-pay

## 2024-07-29 ENCOUNTER — Other Ambulatory Visit: Payer: Self-pay | Admitting: Family

## 2024-07-29 DIAGNOSIS — F419 Anxiety disorder, unspecified: Secondary | ICD-10-CM

## 2024-07-29 NOTE — Telephone Encounter (Signed)
 Pt and her mother have called to follow up with medication refill request x 2

## 2024-10-12 ENCOUNTER — Other Ambulatory Visit: Payer: Self-pay | Admitting: Family

## 2024-10-12 DIAGNOSIS — F419 Anxiety disorder, unspecified: Secondary | ICD-10-CM

## 2024-11-08 ENCOUNTER — Other Ambulatory Visit: Payer: Self-pay | Admitting: Family

## 2024-11-08 DIAGNOSIS — F419 Anxiety disorder, unspecified: Secondary | ICD-10-CM

## 2024-11-22 ENCOUNTER — Encounter: Payer: Self-pay | Admitting: Family

## 2024-11-22 ENCOUNTER — Other Ambulatory Visit: Payer: Self-pay

## 2024-11-22 DIAGNOSIS — F419 Anxiety disorder, unspecified: Secondary | ICD-10-CM

## 2024-11-22 MED ORDER — AUVELITY 45-105 MG PO TBCR: 1.0000 | EXTENDED_RELEASE_TABLET | Freq: Two times a day (BID) | ORAL | 0 refills | Status: AC
